# Patient Record
Sex: Male | Born: 1935
Health system: Southern US, Community
[De-identification: ages and names within clinical notes are randomized; demographics above are authoritative.]

---

## 2003-02-06 ENCOUNTER — Inpatient Hospital Stay (HOSPITAL_COMMUNITY): Admission: AD | Admit: 2003-02-06 | Discharge: 2003-02-14 | Payer: Self-pay | Admitting: Cardiology

## 2003-02-08 ENCOUNTER — Encounter: Payer: Self-pay | Admitting: Cardiology

## 2003-02-09 ENCOUNTER — Encounter: Payer: Self-pay | Admitting: Cardiothoracic Surgery

## 2003-02-10 ENCOUNTER — Encounter: Payer: Self-pay | Admitting: Cardiothoracic Surgery

## 2004-09-21 ENCOUNTER — Ambulatory Visit: Payer: Self-pay | Admitting: Cardiology

## 2005-04-27 ENCOUNTER — Ambulatory Visit: Payer: Self-pay | Admitting: Cardiology

## 2005-05-02 ENCOUNTER — Ambulatory Visit: Payer: Self-pay | Admitting: Internal Medicine

## 2005-11-24 ENCOUNTER — Ambulatory Visit: Payer: Self-pay | Admitting: Cardiology

## 2005-12-11 ENCOUNTER — Emergency Department (HOSPITAL_COMMUNITY): Admission: EM | Admit: 2005-12-11 | Discharge: 2005-12-11 | Payer: Self-pay | Admitting: Emergency Medicine

## 2006-01-19 ENCOUNTER — Ambulatory Visit: Payer: Self-pay | Admitting: Pulmonary Disease

## 2006-01-19 ENCOUNTER — Inpatient Hospital Stay (HOSPITAL_COMMUNITY): Admission: RE | Admit: 2006-01-19 | Discharge: 2006-01-25 | Payer: Self-pay | Admitting: Neurosurgery

## 2006-01-22 ENCOUNTER — Ambulatory Visit: Payer: Self-pay | Admitting: Physical Medicine & Rehabilitation

## 2006-03-21 ENCOUNTER — Ambulatory Visit: Payer: Self-pay | Admitting: Pulmonary Disease

## 2006-07-30 ENCOUNTER — Ambulatory Visit: Payer: Self-pay | Admitting: Cardiology

## 2006-12-14 ENCOUNTER — Ambulatory Visit: Payer: Self-pay | Admitting: Pulmonary Disease

## 2007-01-23 ENCOUNTER — Ambulatory Visit: Payer: Self-pay | Admitting: Cardiology

## 2007-02-05 ENCOUNTER — Ambulatory Visit: Payer: Self-pay | Admitting: Cardiology

## 2007-12-09 IMAGING — CR DG CHEST 2V
2 series · 2 of 2 positions shown · non-contrast
Comparison: 01/24/06.

CLINICAL DATA: 82 year old; COPD.
 CHEST - 2 VIEW:

[view not recorded (1 of 2)]
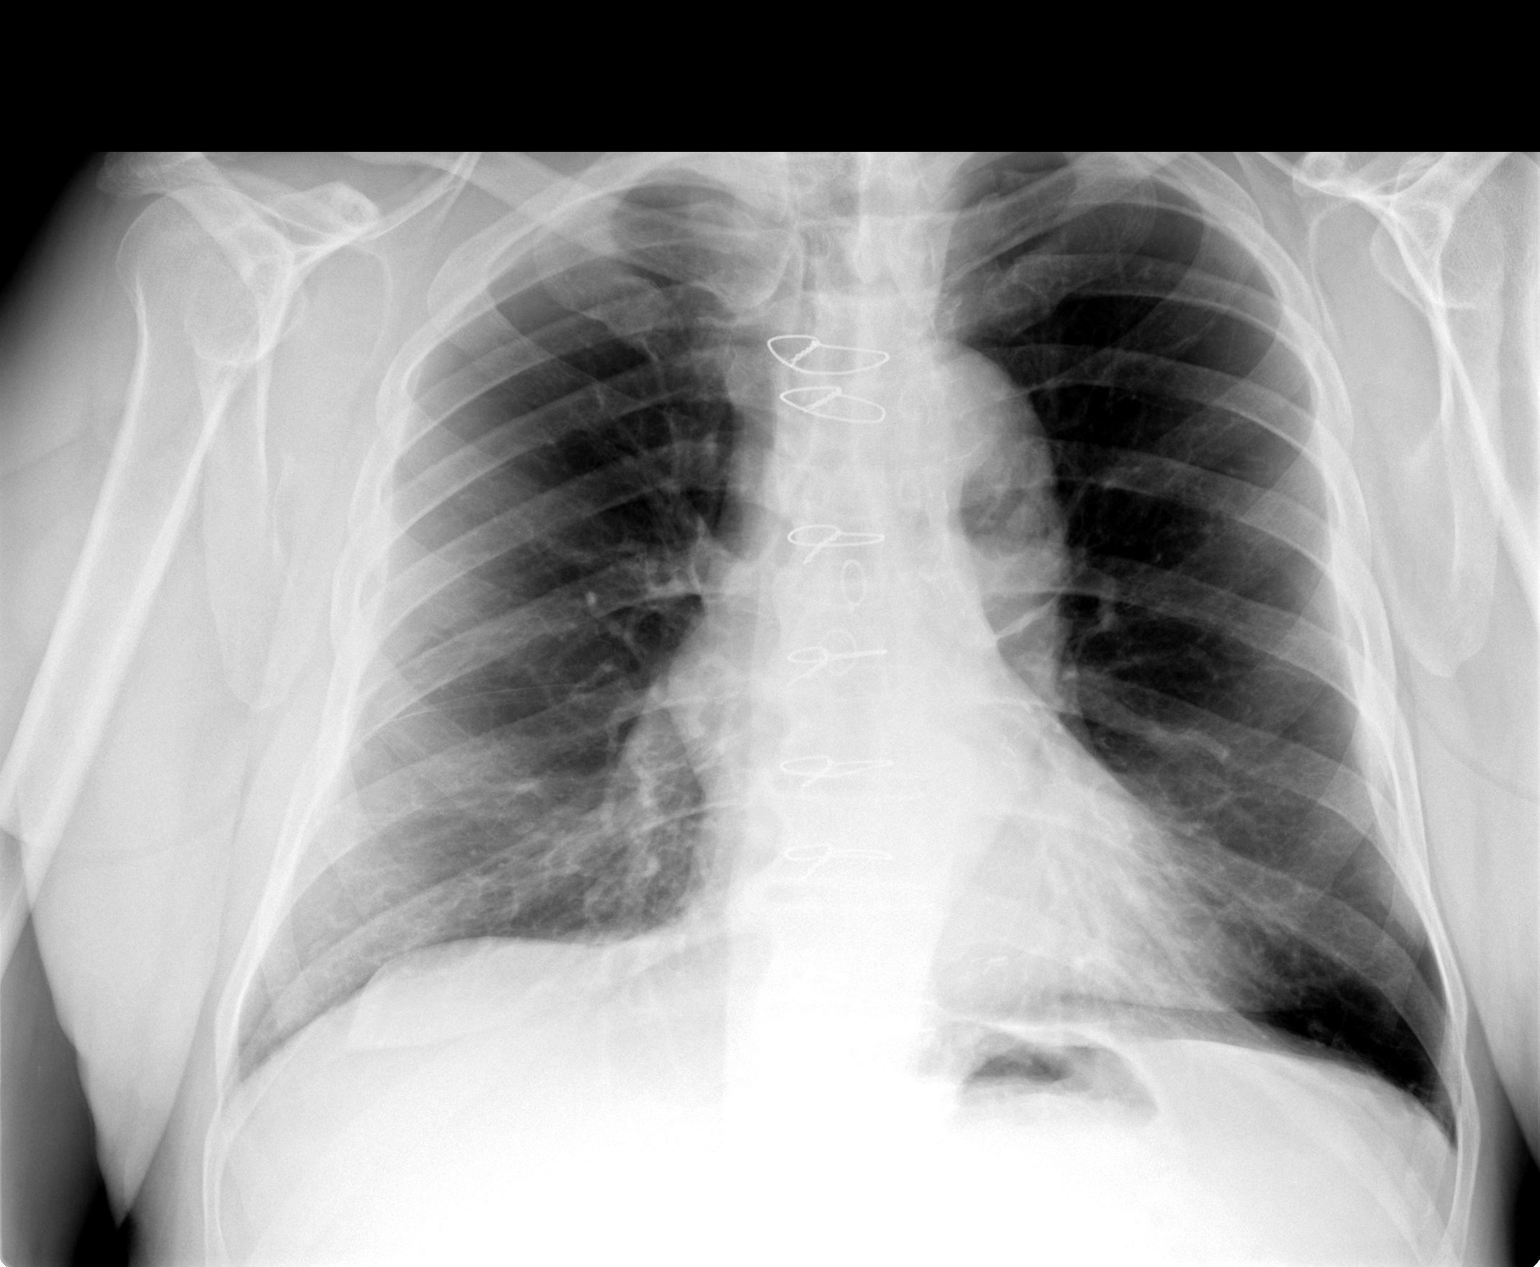

[view not recorded (2 of 2)]
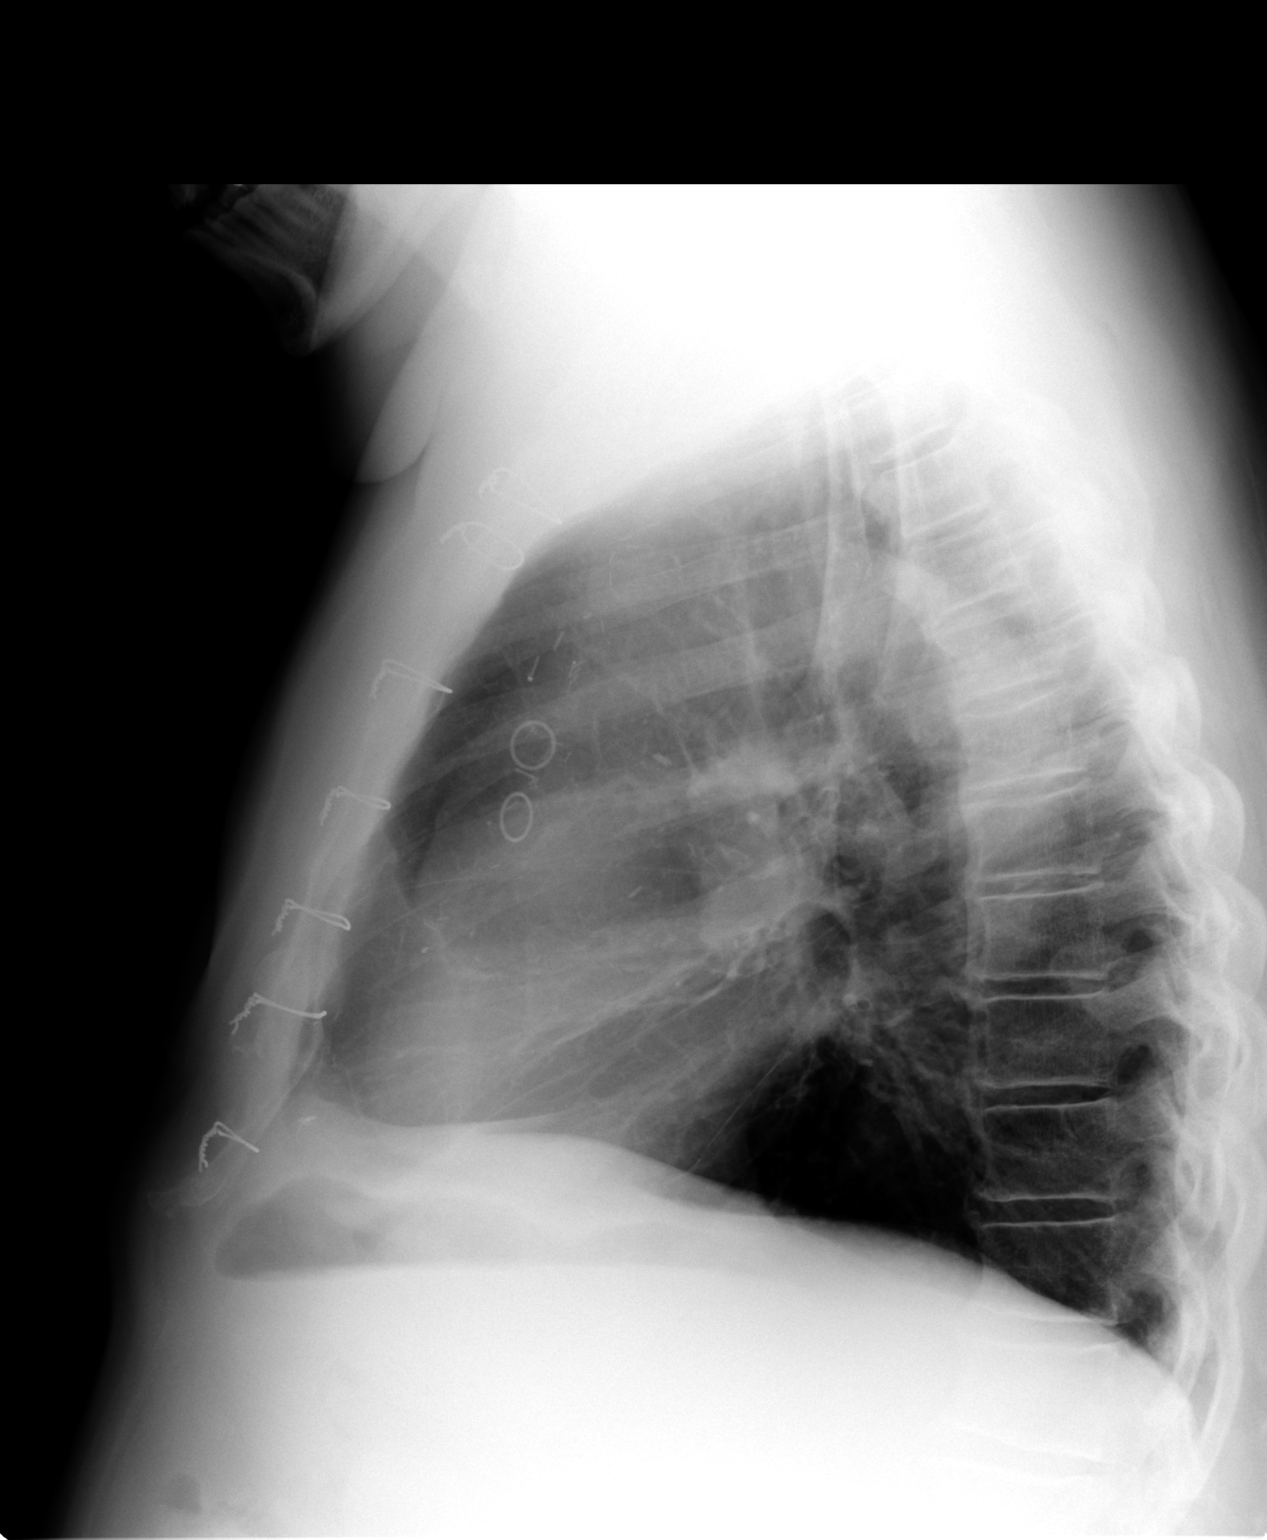

[2 of 2 positions shown; findings below may reference images not displayed]

FINDINGS: The cardiac silhouette, mediastinal and hilar contours are within normal limits and stable.  There is tortuosity and ectasia of the thoracic aorta.  There are underlying changes of COPD.  There are low lung volumes with vascular crowding and bibasilar atelectasis.  No edema or effusions.  The bony structures are intact.
IMPRESSION: 1.  Stable underlying changes of COPD.  
 2.  Low lung volumes with vascular crowding and basilar atelectasis.

## 2010-08-30 NOTE — Assessment & Plan Note (Signed)
[facility] OFFICE NOTE   Zachary, Fuentes                    MRN:          [id number]  DATE:01/23/2007                            DOB:          October 02, 1923    CARDIOLOGIST:  Dr. Terald Sleeper.   PRIMARY CARE PHYSICIAN:  Dr. Rowe Pavy.   REASON FOR VISIT:  Six month follow up.   HISTORY OF PRESENT ILLNESS:  [name] is a very pleasant 77 year old  male patient with a history of coronary artery disease status post CABG  in 2004 who presents to the office today for a six month follow up.  Since we last saw him, he has been admitted to [facility], at  least twice, with chronic obstructive pulmonary disease exacerbation.  The last admission was actually January 16, 2007 and he was discharged on  October 5. He was treated with steroids, antibiotics, and Mucinex.   In the office today, the patient notes that he is improving. His cough  is reduced and his breathing is much better.   He is unfortunately unsure about his medications. He is no longer on  hydrochlorothiazide or amlodipine. His wife is actually with him today  and says that he has never had high blood pressure. He has run out of  his Metoprolol and Lovastatin.   The patient does tell me that he has had some substernal chest pain that  he describes as pressure. This can come on at any time and it seems to  be better with changes in head positioning. He denies any associated  shortness of breath, nausea, or diaphoresis. He does feel it in his  shoulders and down his bilateral arms. His wife says that he has been  complaining of this pain for the last two years and there has been no  change. He denies orthopnea, PND, or pedal edema. He does note chronic  dyspnea with exertion and describes NYHA class 2B symptoms. He denies  any syncope and near-syncope.   He does tell me today that he has had some right leg pain with walking.  This is in his  calf.   CURRENT MEDICATIONS:  1. Glimepiride 4 mg daily.  2. Metoprolol 25 mg 1/2 tablet b.i.d. - he has currently been out for      the last week.  3. Metformin 500 mg b.i.d.  4. Aspirin 81 mg daily.  5. Lovastatin 40 mg nightly - he has been out of this medication for      about a week.  6. Foradil 12 mcg inhaler b.i.d.  7. Spiriva 1 inhalation daily.  8. Pro Air p.r.n.  9. Levaquin 500 mg daily until finished.  10.Mucinex 600 mg b.i.d.  11.Prednisone taper.   PHYSICAL EXAMINATION:  GENERAL:  He is a well developed, well nourished  male in no acute distress.  VITAL SIGNS:  Blood pressure 120/75, pulse 72, weight 185.4 pounds.  HEENT:  Normal.  NECK:  Without JVD.  CARDIAC:  Normal S1, S2. Regular rate and rhythm without murmur.  LUNGS:  Clear to auscultation bilaterally without wheezing, rhonchi, or  rales.  ABDOMEN:  Soft and nontender with normal active bowel sounds, no  organomegaly.  EXTREMITIES:  Without edema. Calves are soft and nontender.  SKIN:  Warm and dry.  NEUROLOGIC:  He is alert and oriented x3. Cranial nerves II-XII are  intact.  VASCULAR:  Carotids without bruits bilaterally. Femoral artery pulses  are 2+ bilaterally without bruits. Dorsalis pedis and posterior tibialis  pulses are 2+ bilaterally.   Electrocardiogram in the office today reveals sinus rhythm with a heart  rate of 70. Left axis deviation, no acute changes.   IMPRESSION:  1. Chest pain.  2. Coronary artery disease.      a.     Status post CABG in 2004.      b.     Non-ischemic Cardiolite study in January 2007.  3. Good LV function.  4. Hypertension.  5. Dyslipidemia.  6. Diabetes mellitus.  7. Chronic obstructive pulmonary disease.      a.     Status post recent admission for chronic obstructive       pulmonary disease exacerbation.  8. Right lower extremity pain with exertion.      a.     Rule out claudication.   PLAN:  The patient presents to the office today for follow up. He  has  been admitted to the hospital twice for chronic obstructive pulmonary  disease exacerbations over the last several months. He was just  discharged recently. Unfortunately, he is not taking some of his  medications. He does have hydrochlorothiazide and Norvasc listed on his  medications, but his blood pressure looks to be fairly well controlled.  He should be on Metoprolol, as he does have a history of inferior  hypokinesis on a nuclear study in the past. He should also be on a  statin for his cholesterol. He is complaining of some chest and shoulder  pain. This is somewhat atypical, as it has been ongoing for the last  couple of years. However, he does have history of coronary artery  disease and his grafts are over 5 years old now. At this point in  time, we plan:  1. Proceed with stress Cardiolite testing to rule out the possibility      of ischemia.  2. Refill his Lovastatin and Metoprolol.  3. Make sure that he has follow up lipids and LFTs in the next several      weeks.  4. We will arrange ABIs and arterial Dopplers to rule out the      possibility of peripheral arterial disease.  5. We will bring him back in follow up with Dr. Dannielle Burn in the next      couple of weeks to review the above testing.      Richardson Dopp, PA-C  Electronically Signed      Ernestine Mcmurray, MD,FACC  Electronically Signed   SW/MedQ  DD: 01/23/2007  DT: 01/24/2007  Job #: [id number]   cc:   Sherril Cong, MD

## 2010-08-30 NOTE — Assessment & Plan Note (Signed)
[facility]                             PULMONARY OFFICE NOTE   JAMESRYAN, KRAMMER                    MRN:          [id number]  DATE:12/14/2006                            DOB:          30-Dec-1923    I saw Mr. Korb today in follow up for his chronic obstructive  pulmonary disease.   He says that he was admitted to [facility] about 3 to 4 weeks  ago. He was treated for a respiratory problem although he is not exactly  sure what it was. From his symptom description it sounds like he had a  chronic obstructive pulmonary disease exacerbation, and I believe that  he was treated with a course of antibiotics and possibly steroids. He  says that since that time his breathing has improved and he is feeling  reasonably well. He is not having any problems with coughing, sputum  production, or wheezing. He also denies symptoms of chest pain.   CURRENT MEDICATIONS:  1. Spiriva 1 puff daily.  2. Foradil 1 puff b.i.d.  3. Aspirin 81 mg daily.  4. Norvasc 10 mg daily.  5. Metformin 500 mg b.i.d.  6. Amaryl 4 mg daily.  7. Lovastatin 40 mg nightly.  8. ProAir HFA 2 puffs q.i.d. p.r.n.   PHYSICAL EXAMINATION:  VITAL SIGNS:  Weight 184 pounds, temperature  97.7, blood pressure 110/74, heart rate 80, oxygen saturation 94% on  room air.  HEENT:  There is no sinus tenderness, no oral lesions, no  lymphadenopathy.  HEART:  S1, S2.  CHEST:  Prolonged expiratory phase, but no wheezing or rales.  ABDOMEN:  Obese, soft, nontender.  EXTREMITIES:  No edema.   IMPRESSION:  Chronic obstructive pulmonary disease. He is doing  reasonably well on his inhaler regimen of Spiriva and Foradil, and I  would continue to monitor this. If he does start developing more  frequent exacerbations, it may be beneficial to add in an inhaled  corticosteroid to his regimen. I will also have him undergo a chest x-  ray in my office today and I will call him with the results  of this  sometime next week. If there are any abnormalities on this, I will  arrange for further intervention. Otherwise, I plan on following up with  him in approximately six months.     [name], MD  Electronically Signed    VS/MedQ  DD: 12/14/2006  DT: 12/16/2006  Job #: [id number]

## 2010-09-02 NOTE — Assessment & Plan Note (Signed)
[facility]                             PULMONARY OFFICE NOTE   [name], [name]                    MRN:          [id number]  DATE:03/21/2006                            DOB:          1923-08-15    I saw [name] today in followup for his COPD.   He is doing quite well with his inhaler regimen of Spiriva 1 puff daily,  and Foradil 1 puff b.i.d. He is no longer having any symptoms of  coughing, wheezing, sputum production or chest tightness. He says really  right now his activities are limited more by his hip and leg pain,  rather than his breathing.   CURRENT MEDICATIONS:  1. Spiriva 1 puff daily.  2. Foradil 1 puff b.i.d.  3. Aspirin 81 mg daily.  4. Norvasc.  5. Lopressor.  6. Metformin.  7. Amaryl.   PHYSICAL EXAMINATION:  Weight 165 pounds, temperature 97.7, blood  pressure 116/74, heart rate 77, oxygen saturation 97% on room air.  HEENT: There is no sinus tenderness, no nasal discharge, no oral  lesions.  HEART: S1, S2.  CHEST: No wheezing or rales.  ABDOMEN: Soft, non-tender.  EXTREMITIES: No edema.   IMPRESSION:  Chronic obstructive pulmonary disease with good response to  his inhaler regimen of Spiriva and Foradil. I have given prescriptions  to continue on this. He says that he believes he had received his  influenza vaccination prior to hospital discharge, and he had also had  his pneumonia shot approximately 4 years ago.   I will followup with him in approximately 6 months.     Zachary Mires, MD  Electronically Signed    VS/MedQ  DD: 03/21/2006  DT: 03/22/2006  Job #: [id number]   cc:   [name], M.D.  Zachary Denton, MD,FACC

## 2010-09-02 NOTE — Assessment & Plan Note (Signed)
[facility] OFFICE NOTE   LARSON, RAITT                    MRN:          [id number]  DATE:11/24/2005                            DOB:          1923-07-16    HISTORY OF PRESENT ILLNESS:  The patient is a 77 year old male with a  history of coronary artery disease, status post coronary artery bypass  grafting in 2004.  The patient was last seen in January 2007.  At that time  he complained of subscapular pain as well as some chest discomfort.  In the  interim the patient has undergone stress testing in January 2007, which  demonstrated normal perfusion data with no inducible ischemia.  The ejection  fraction was 54%.  The patient is now seen in follow-up.  He does appear to  have some lower extremity edema.  He also complains of burning in both feet.  He has a history of chronic back pain and has noted some decreased strength  in the lower extremities.  The patient's wife is concerned regarding  arterial insufficiency; however, the patient has reasonably good pulses on  lower extremity exam.  He denies any chest pain, shortness of breath,  orthopnea or PND.   MEDICATIONS:  1. Doxycycline.  2. Glimepiride 4 mg a day.  3. Metoprolol 25 mg a day.  4. Hydrochlorothiazide 25 mg a day.  5. Metformin 500 mg p.o. b.i.d.  6. Lovastatin 10 mg a day.  7. Norvasc 10 mg a day.   PHYSICAL EXAMINATION:  VITAL SIGNS:  Blood pressure 122/70, heart rate 84  beats per minute, weight is 179 pounds.  NECK:  Normal carotid upstroke, no carotid bruits.  LUNGS:  Clear breath sounds bilaterally.  CARDIAC:  Regular rate and rhythm with normal S1, S2, no murmurs, rubs or  gallops.  ABDOMEN:  Soft, nontender, no rebound or guarding, good bowel sounds.  CHEST:  Sternotomy scar is well-healed.  EXTREMITIES:  No edema, and peripheral pulses are palpable.   PROBLEM LIST:  1. Coronary artery disease, status post coronary  artery bypass grafting.  2. Hypertension, controlled.  3. Dyslipidemia.  4. History of pancreatitis.  5. Remote nephrolithiasis.  6. History of diabetes mellitus.  7. Chronic lumbar disk disease with chronic low back pain.   PLAN:  1. The patient was referred for ABIs.  He also was referred for EMGs and      nerve conduction velocities.  2. Laboratory work was done today.  This includes BMET, TSH and BNP.  3. I explained to the patient and the patient's wife that from a      cardiovascular perspective he appears to be stable.  I also do not      think that his lower extremity edema is secondary to congestive heart      failure.  I also do not think he has peripheral insufficiency.   ADDENDUM:  Addendum dated January 11, 2006.   ABIs were reviewed on this patient, which were 0.94 and 0.96, respectively,  left and right.  There is no evidence of significant vascular insufficiency.  His chest x-ray was within normal limits.  Creatinine was 1.3, potassium was  4.4, chloride was 96.  TSH 1.62.  BNP is 17.  Based on review of this data  and the patient's previous stress test, there should be no contraindication  for this patient to proceed with back surgery in case this is needed.  I  suspect that the patient's chronic low back pain and lower extremity pain is  related to chronic lower back disease.  The patient, I anticipate, will be  seen in the future by neurosurgery.       Ernestine Mcmurray, MD,FACC     GED/MedQ  DD:  01/11/2006  DT:  01/13/2006  Job #:  [id number]   cc:   Sherril Cong, M.D.

## 2010-09-02 NOTE — Cardiovascular Report (Signed)
NAME:  [name], [name] NO.:  [id number]   MEDICAL RECORD NO.:  [id number]                   PATIENT TYPE:  OIB   LOCATION:  2859                                 FACILITY:  [facility]   PHYSICIAN:  Loretha Brasil. Lia Foyer, M.D.             DATE OF BIRTH:  [birth year]   DATE OF PROCEDURE:  02/06/2003  DATE OF DISCHARGE:                              CARDIAC CATHETERIZATION   INDICATIONS:  [name] is a pleasant 77 year old gentleman who presents  with both progressive shortness of breath as well as substernal chest pain.  The patient is preoperative evaluation for hernia repair.  Because of this  he was subsequently referred for further evaluation.  He had a dense  inferior defect with partial reversibility towards the apex noted on  Cardiolite and anteroseptal defect as well.  Ejection fraction was 56% with  inferior hypokinesis.  Because of this he was referred for left heart  catheterization.  He also was thought to have some chronic obstructive  pulmonary disease and possibly pulmonary hypertension and therefore work-up  suggested for right heart catheterization as well.   PROCEDURE:  1. Right and left heart catheterization.  2. Selective coronary arteriography.  3. Selective left ventriculography.  4. Subclavian angiography.   DESCRIPTION OF PROCEDURE:  The patient was brought to the catheterization  laboratory and prepped and draped in the usual fashion.  Through an anterior  puncture after informed consent the right femoral vein was entered.  Right  heart catheterization was performed with a 7.5-French thermodilution Swan-  Ganz catheter.  He tolerated procedure well and there were no complications  with right heart catheterization.  Following this a 6-French sheath was  placed in the left coronary artery.  Several catheters were required to  successfully engage the left main but we engaged successfully with a 6-  [place] guiding catheter JL4.  Good  opacification as achieved in the left  coronary artery.  Following right coronary arteriography, subclavian  angiography was performed and the procedure completed.  The patient  tolerated procedure well and there were no complications.   HEMODYNAMIC DATA:  1. Right atrium 11.  2. RV 33/13.  3. Pulmonary artery 35/15.  4. Pulmonary capillary wedge 14.  5. Aortic 145/82/  6. LV 145/20.  7. No aortic to left ventricular gradient.  8. No mitral valve gradient.  9. Thermodilution cardiac output 5.3 L/minute.  10.      Thermodilution cardiac index 2.7 L/minute/sq m.  11.      Aortic saturation 95%.  12.      Pulmonary artery saturation  73%.  13.      Superior vena cava saturation 74%.   ANGIOGRAPHIC DATA:  1. Ventriculography in the RAO projection revealed well preserved global     systolic function.  No segmental abnormalities of contraction were     identified.  2. The left main coronary artery is free of critical disease.  3. The left anterior descending artery coursed to the apex and supplied a     portion of the distal inferior wall.  There is a tiny first diagonal     branch and then a second diagonal branch that had 75% segmental     narrowing.  Just after the second diagonal branch and septal perforator     there is a 75% stenosis followed by an area of aneurysmal dilatation.     Following the area of aneurysmal dilatation there is segmental diffuse     disease of about 80%.  The distal LAD is a graftable vessel.  4. The circumflex provides predominantly one bifurcating marginal.  There is     about 75% segmental narrowing in the mid portion followed by an area of     aneurysmal dilatation.  The vessel after this bifurcates.  5. The right coronary artery has about a 20-30% mid area and has diffuse     ectasia and aneurysmal dilatation throughout without any high grade areas     of focal stenosis.  6. The subclavian appears to be widely patent.   CONCLUSIONS:  1. Preserved  left ventricular function.  2. High grade disease of the left anterior descending artery that is     unfavorable for percutaneous stenting and involves a second diagonal.  3. Significant disease of the circumflex coronary artery.   RECOMMENDATIONS:  Surgical consultation for possible revascularization.                                               Loretha Brasil. Lia Foyer, M.D.    TDS/MEDQ  D:  02/06/2003  T:  02/06/2003  Job:  [id number]   cc:   Satira Sark, M.D. [facility]   Sherie Don, M.D.   [facility]

## 2010-09-02 NOTE — Consult Note (Signed)
NAMEJEBIDIAH, [name] NO.:  [id number]   MEDICAL RECORD NO.:  [id number]          PATIENT TYPE:  INP   LOCATION:  3032                         FACILITY:  [facility]   PHYSICIAN:  [name], MD        DATE OF BIRTH:  02-Sep-1923   DATE OF CONSULTATION:  01/24/2006  DATE OF DISCHARGE:                                   CONSULTATION   REFERRING PHYSICIAN:  Marchia Meiers. Vertell Limber, M.D.   REASON FOR CONSULTATION:  Evaluation of hypoxemia.   Mr. Fuentes is a 77 year old male who underwent lumbar laminectomy with  diskectomy and fusion with pedicle screw placement on October 5.  Postoperatively he has been recovering as expected but has had episodes of  oxygen saturation down to the mid 80s while off of oxygen.  The patient  apparently has chronic symptoms of dyspnea at baseline and he says that he  is currently at his baseline status at present.  He says that when he gets  his nebulizer treatments here in the hospital he notes a significant  improvement in symptoms.  His outpatient inhaler regimen has consisted of  albuterol as needed which he uses four to five times a day.  He also  complained of having chronic cough with production of whitish to yellow  green sputum as well as having chest tightness.   PAST MEDICAL HISTORY:  Significant for coronary artery disease status post  coronary bypass grafting 2004.  He has had a prior myocardial infarction.  He has diabetes, COPD.  He had an aortic aneurysm.   SOCIAL HISTORY:  He stopped smoking 20 years ago, smoked about two packs per  week but also smoked two to three cigars per day.  He says he started at the  age of 62.  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   FAMILY HISTORY:  Significant for asthma.  Three of his sisters had cancer.  Heart disease and diabetes also run in the family.   ALLERGIES:  He has no known drug allergies.   MEDICATIONS AT HOME:  Albuterol, lovastatin, metformin, hydrocodone,  hydrochlorothiazide, metoprolol, Amaryl, Klor-Con and aspirin.   REVIEW OF SYSTEMS:  Is essentially negative except as stated above.   PHYSICAL EXAM:  VITAL SIGNS:  Temperature 97.2, heart rate 67, blood  pressure 128/68, respiratory rate is 18, oxygen saturation 92% on 2 L.  HEENT:  Pupils reactive.  Extraocular movements are intact. There is no  sinus tenderness.  There is no JVD.  No oral lesions.  No lymphadenopathy.  HEART:  Was S1-S2, regular rate and rhythm.  CHEST:  Decreased breath sounds with fine rales, fine wheezing heard, but no  rubs.  ABDOMEN:  Was thin, soft, nontender.  EXTREMITIES:  There is no edema, cyanosis or clubbing.  NEUROLOGIC:  No focal deficits were appreciated.  Chest x-ray showed flattening of the diaphragms with hyperinflation.   MOST RECENT LABS:  Show a WBC of 10.6, hemoglobin is 15.9, hematocrit  __________ , platelet count is 365, sodium 141, potassium 4.6, chloride is  103, CO2 is 27, BUN is 12, creatinine is  1.2, glucose is 80.   IMPRESSION:  Hypoxemia with persistent dyspnea with probable chronic  respiratory failure.  This is most likely secondary to obstructive airway  disease with chronic obstructive pulmonary disease.  I will change his  inhaler regimen so that he will be started on Spiriva 1 puff q.d. as well as  Foradil 1 puff b.i.d. I will check a room air arterial blood gas to  determine if he needs to be set up for home oxygen therapy.  He will need to  have outpatient pulmonary function tests done.  I will also give him a short  course of antibiotics with the Zithromax to help improve his bronchitis  symptoms then depending on his response to this, I determine if he needs to  have an inhaled corticosteroid added to his medical regimen.      [name], MD  Electronically Signed     VS/MEDQ  D:  01/24/2006  T:  01/25/2006  Job:  [id number]

## 2010-09-02 NOTE — Assessment & Plan Note (Signed)
[facility] CARDIOLOGY OFFICE NOTE   [name], [name]                    MRN:          [id number]  DATE:07/30/2006                            DOB:          [birth year]    HISTORY OF PRESENT ILLNESS:  The patient is a 77 year old male with  history of coronary artery disease status post cardiac bypass graft in  2004.  The patient had a stress test in 2007, which was negative for  ischemia.  He has been doing well and reports no chest pain.  He  underwent back surgery in October of 2007.  He reports no palpitations  or syncope.  From a cardiovascular perspective, he is asymptomatic.  He  does complain of neck pain.   MEDICATIONS:  1. Glimepiride 4 mg p.o. daily.  2. Metoprolol 25 mg p.o. daily.  3. Hydrochlorothiazide 25 mg a day.  4. Metformin 500 mg p.o. b.i.d.  5. Naproxen 500 mg p.o. b.i.d.  6. Aspirin 81 mg a day.   PHYSICAL EXAMINATION:  VITAL SIGNS:  Blood pressure 140/82.  Heart rate  67.  Weight 179 pounds.  NECK:  Normal carotid upstroke.  No carotid bruits.  LUNGS:  Clear breath sounds bilaterally.  HEART:  Regular rate and rhythm, normal S1, S2.  No murmurs, rubs, or  gallops.  ABDOMEN:  Soft, nontender.  No rebound or guarding.  Good bowel sounds.  EXTREMITIES:  No cyanosis, clubbing, or edema.  NEUROLOGIC:  Patient alert, oriented, and grossly nonfocal.   PROBLEMS:  1. Coronary artery disease status post coronary artery bypass graft.  2. Hypertension, controlled.  3. Dyslipidemia.  4. History of pancreatitis.  5. History of remote nephrolithiasis.  6. Diabetes mellitus.  7. Status post lower back pain.   PLAN:  1. The patient is doing well from a cardiovascular perspective.  2. The patient will need to have a lipid panel done and this can be      done by Dr. Sherrie Sport.  3. From a cardiovascular perspective, no changes were made in the      patient's medical regimen.     Ernestine Mcmurray,  MD,FACC  Electronically Signed    GED/MedQ  DD: 07/30/2006  DT: 07/31/2006  Job #: [id number]

## 2010-09-02 NOTE — Op Note (Signed)
   NAME:  [name], [name]                       ACCOUNT NO.:  [id number]   MEDICAL RECORD NO.:  [id number]                   PATIENT TYPE:  INP   LOCATION:  4729                                 FACILITY:  [facility]   PHYSICIAN:  Sigmund I. Gaynelle Arabian, M.D.         DATE OF BIRTH:  08-Mar-1924   DATE OF PROCEDURE:  02/09/2003  DATE OF DISCHARGE:                                 OPERATIVE REPORT   PROCEDURE:  Intraoperative consultation and procedure for patient with  inability to pass Foley catheter prior to coronary artery bypass surgery.   OBJECTIVE:  With the patient in a supine position, cystourethroscopy was  accomplished with the flexible cystoscope.  A severe obstruction of the  prostatic urethra was identified, with the prostate completely obliterated  except for a tiny approximately 2-3 mm opening.  There were several false  passages identified as well.  An 0.035 guidewire was passed through the  lumen under direct vision, and the stricture was subsequently dilated to a  size 22 [place] with the Haymann dilators.  Following this, the specialized  16 Foley catheter was placed without difficulty and irrigated free of any  blood.  The patient tolerated this portion of the procedure well, was  covered with IV antibiotics.  He will be followed up in the hospital  following his surgery.                                               Sigmund I. Gaynelle Arabian, M.D.    SIT/MEDQ  D:  02/09/2003  T:  02/09/2003  Job:  [id number]   cc:   Len Childs, M.D.  [address]  [place]  [place] [address]

## 2010-09-02 NOTE — Discharge Summary (Signed)
NAME:  [name], [name] NO.:  [id number]   MEDICAL RECORD NO.:  [id number]                   PATIENT TYPE:  INP   LOCATION:  2041                                 FACILITY:  [facility]   PHYSICIAN:  Ivin Poot, M.D.               DATE OF BIRTH:  1924-04-05   DATE OF ADMISSION:  02/06/2003  DATE OF DISCHARGE:  02/14/2003                                 DISCHARGE SUMMARY   HISTORY OF PRESENT ILLNESS:  This is a 77 year old married white male  with  no prior history of coronary artery disease, who was seen by Dr. Dannielle Burn on  January 21, 2003, for preoperative evaluation  prior to hernia repair. The  patient had risk factors for coronary artery disease including age, gender,  hypertension and dyslipidemia. He also had a previous  history of chest  discomfort  and was seen in 2001, but that was felt to be musculoskeletal,  and a Myoview study was done showing no ischemia at that time.   The patient underwent a nuclear stress test on January 23, 2003, and  exercised to a maximum work load of 5.6 mets without chest pain. There were  no diagnostic EKG changes, however, he had fairly dense inferior defect with  partial reversibility towards the apex and also involving the  antero septum  at the apex. The ejection fraction was 56% with inferior  hypokinesis. This  was felt to be consistent with underlying coronary artery disease and he was  felt  to require further  delineation of  his anatomy and he was admitted  this hospitalization  for cardiac catheterization and further treatment  pending those results.   ALLERGIES:  No known drug allergies.   MEDICATIONS:  1. Lipitor 10 mg q. h.s.  2. Norvasc 10 mg every day.  3. Theo-Dur 300 mg b.i.d.  4. Protonix 40 mg daily.  5. Albuterol via nebulizer every day and albuterol hand-held nebulizer every     day p.r.n.  6. Clarinex 5 mg every day.  7. Percocet p.r.n.  8. Aspirin 1 every day.   PAST MEDICAL HISTORY:  1. Hypertension.  2. Hyperlipidemia.  3. History of nephrolithiasis.   Family history, social history, review of systems and physical examination  please see the history and physical done at the time of admission.   HOSPITAL COURSE:  The patient was admitted and electively underwent cardiac  catheterization on February 06, 2003, by Dr. Lia Foyer. He was found to have  multivessel coronary artery disease including  a severe lesion at the LAD  measured at 75%, followed  by aneurysmal dilatation as well as an 80% mid  artery lesion with diffuse disease. Other findings include a 75% left  circumflex lesion also followed  by aneurysmal dilatation and the right  coronary artery had a 20% to 30% mid vessel lesion.   Due to these findings, the patient was felt to be  a candidate for surgical  revascularization and a cardiac surgical consultation was obtained with Dr.  Tharon Aquas Trigt who evaluated the patient and his studies and recommended  the procedure. The patient agreed and on February 09, 2003, the patient  underwent the following procedure, coronary artery bypass grafting x3. The  following grafts were placed:  1. Left internal mammary artery to the left anterior descending artery.  2. Saphenous vein graft to the OM1.  3. Saphenous vein graft  to the diagonal.   The patient tolerated the procedure well. He was taken to the surgical  intensive care unit in stable condition. Of note the patient was found prior  to the procedure to have a urethral stricture and required cystoscopic  passage of his Foley catheter by Dr. Gaynelle Arabian. The findings included  several false passages.   The patient has done well postoperatively. He  has maintained stable  hemodynamics. His intensive care unit stay was unremarkable. His initial  hematocrit postoperatively was 30 on postoperative day #1. Other laboratory  values have remained stable. He has tolerated a rapid  increase  in  activities commensurate   for the level of postoperative convalescence using  cardiac rehabilitation phase 1 modalities. His incisions are healing well.  He has been weaned from oxygen and maintains good saturations on room air.  All routine lines, monitors and drainage devices have been discontinued in a  standard fashion. Currently the patient does have his Foley catheter in  place, but pending  a voiding trial, this will be removed, and if there are  no difficulties, tentatively he is felt to be quite stable for discharge  on  the morning of February 14, 2003, pending  reevaluate. The patient is also  noted to have had no significant cardiac dysrhythmias or ectopy  postoperatively.   DISCHARGE MEDICATIONS:  1. Claritin 5 mg daily.  2. Lipitor 10 mg q. h.s.  3. Theophylline 300 mg b.i.d.  4. Aspirin 325 mg p.o. daily.  5. Protonix 40 mg daily.  6. Toprol XL 25 mg daily.  7. Ultram 50 mg 1 q.6h. p.r.n. pain.   DISCHARGE INSTRUCTIONS:  The patient received written instructions regarding  medications, activity, diet, wound care and follow up.   FOLLOW UP:  Dr. Dannielle Burn on Wednesday March 04, 2003, at 11:30 and Dr. Prescott Gum on Friday March 27, 2003, at 1:45.   CONDITION ON DISCHARGE:  Stable and improved.   FINAL DIAGNOSES:  1. Severe coronary artery disease as delineated above.  2. Hypertension.  3. Hyperlipidemia.  4. History of nephrolithiasis.  5. Urethral strictures with several false passages per cystoscopy done prior     to surgical revascularization of his coronaries.  6. Mild postoperative anemia.      John Giovanni, P.A.-C.                    Ivin Poot, M.D.    Loren Racer  D:  02/13/2003  T:  02/14/2003  Job:  [id number]   cc:   Len Childs, M.D.  [address]  [place]  [place] [address]   Ernestine Mcmurray, M.D.  [address]  [place] [address]   Sigmund I. Gaynelle Arabian, M.D.  [place] [place]  [place] [address]  Fax: [phone]

## 2010-09-02 NOTE — H&P (Signed)
NAME:  [name], [name] NO.:  [id number]   MEDICAL RECORD NO.:  [id number]                   PATIENT TYPE:  OIB   LOCATION:                                       FACILITY:  [facility]   PHYSICIAN:  Satira Sark, M.D. [facility]        DATE OF BIRTH:  [birth year]   DATE OF ADMISSION:  02/06/2003  DATE OF DISCHARGE:                                HISTORY & PHYSICAL   CHIEF COMPLAINT:  Chest discomfort.   HISTORY OF PRESENT ILLNESS:  The patient is a 77 year old married white male  with no prior history of coronary artery disease, seen by Dr. Dannielle Burn on  January 21, 2003, for preoperative evaluation  before he underwent  hernia  repair. The patient had risks for coronary artery disease which included his  age, gender, hypertension and dyslipidemia. He also had a prior history of  chest discomfort  and was seen in 2001. At that time it was felt that his  discomfort  was musculoskeletal. A Myoview study was done and did not show  any ischemia.   As of late he reported to Dr. Waymon Amato on his preoperative evaluation that over  several weeks he has had  brief substernal chest discomfort. Some of his  discomfort is worse when he takes a deep breath. He will feel radiating pain  through to his back. He is fairly active and cuts his own lawn, but in  talking to him today  further  and talking with his wife, he actually is  very dyspneic just walking outside to the garage. He does not tend to have  chest discomfort, but is severely limited by dyspnea on exertion.   He was set up for and underwent nuclear stress testing on January 23, 2003.  The patient exercised to a maximum work load of 5.6 mets. He did not report  any chest pain. There were no diagnostic EKG changes. He had a fairly dense  inferior defect with partial reversibility towards the apex and also  involving the anteroseptum at the apex. The ejection fraction was 56% with  inferior  hypokinesis. This would be  consistent with underlying coronary  artery disease.   Because of this the patient will be admitted and undergo elective same day  heart  catheterization to define his coronary anatomy preoperatively. We  will also start him on 1 enteric coated aspirin a day and will increase his  Norvasc from 5 mg to 10 mg every day for better  blood pressure control.   ALLERGIES:  No known drug allergies.   MEDICATIONS:  1. Lipitor 10 mg p.o. q. h.s.  2. Norvasc 5 mg every day which was just  increased  to 10 mg every day.  3. Theo-Dur 300 mg b.i.d.  4. Protonix 40 mg every day.  5. Albuterol via nebulizer every day and albuterol hand-held nebulizer every     day p.r.n.  6. Clarinex 5 mg every  day.  7. Percocet  p.r.n.  8. We will also start him on 1 aspirin  every day.   PAST MEDICAL HISTORY:  1. Hypertension.  2. Hyperlipidemia.  3. History of nephrolithiasis.   REVIEW OF SYSTEMS:  He denies any recent weight gain, weight loss, fevers or  chills. He denies any chronic diarrhea or constipation. He has mild lower  extremity edema, worse with prolonged standing.   PHYSICAL EXAMINATION:  VITAL SIGNS:  Blood pressure 152/74, pulse 80 and  regular.  GENERAL:  Weight 193. An overweight, elderly white male in no acute  distress.  NECK:  Without jugular venous distention or carotid bruits.  LUNGS:  Decreased breath sounds at the bases but no wheezing, rales or  rhonchi.  HEART:  Heart sounds are distant and difficult to hear. Normal S1 and S2  without any detectable murmurs, rubs or clicks.  ABDOMEN:  Obese, protuberant, bowel sounds are present.  EXTREMITIES:  1+ pitting edema bilaterally. Pedal pulses palpable at 1+.  NEUROLOGIC:  He is alert and oriented and is grossly within normal limits.   IMPRESSION:  1. Substernal chest pain with positive nuclear study for ischemia on January 23, 2003. The patient being admitted now for diagnostic heart     catheterization to define his coronary  anatomy. The patient agrees,     understands and accepts the risks and benefits of the explained     procedure. We will add aspirin to his medical therapy.  2. Lower extremity edema, possibly secondary to pulmonary hypertension     and/or right heart failure. We may want to start low-dose diuretic at     some point.  3. Hypertension. Will increase his Norvasc to 10 mg every day and realize     this may be playing a role in some of his lower extremity edema, but     would like more optimal blood pressure control.  4. History of hyperlipidemia, on Lipitor.   PLAN:  Start aspirin, increase Norvasc, schedule for diagnostic heart  catheterization with further  recommendations pending acquisition of above.  We suggest this be a right and left heart  catheterization because of his  pulmonary disease.         Otelia Limes. Kathe Mariner.                    Satira Sark, M.D. [facility]    MRD/MEDQ  D:  02/02/2003  T:  02/02/2003  Job:  [id number]   cc:   Dr. Hyman Hopes  [facility]  [place], [place]   Tacey Heap, M.D.  [facility]  [place], [place]   Dr. [facility]

## 2010-09-02 NOTE — Op Note (Signed)
[name], [name] NO.:  [id number]   MEDICAL RECORD NO.:  [id number]          PATIENT TYPE:  INP   LOCATION:  3032                         FACILITY:  [facility]   PHYSICIAN:  Marchia Meiers. Vertell Limber, M.D.  DATE OF BIRTH:  Dec 31, 1923   DATE OF PROCEDURE:  01/19/2006  DATE OF DISCHARGE:                                 OPERATIVE REPORT   PREOPERATIVE DIAGNOSES:  Spondylosis, scoliosis, degenerative disk disease  and radiculopathy, L3-4 and L4-5 levels.   POSTOPERATIVE DIAGNOSES:  Spondylosis, scoliosis, degenerative disk disease  and radiculopathy, L3-4 and L4-5 levels.   PROCEDURES:  1. Laminectomy and diskectomy, L3-4 and L4-5 levels.  2. Bilateral laminectomy and diskectomy, L3-4 and L4-5 levels, with      posterior lumbar interbody fusion with 10-mm PEEK interbody cages      bilaterally, L3-4 and L4-5 levels, with Osteocel, and pedicle-screw      fixation, L3 through L5 bilaterally, with posterolateral arthrodesis.   SURGEON:  Marchia Meiers. Vertell Limber, M.D.   ASSISTANTS:  Ashok Pall, M.D.; [place], RN.   ANESTHESIA:  General endotracheal.   ESTIMATED BLOOD LOSS:  800 cc, with 500 cc of Cell Saver blood returned to  the patient.   COMPLICATIONS:  None.   DISPOSITION:  Recovery.   INDICATIONS:  [name] is a 77 year old man with severe right leg  greater than left leg pain with spondylosis, scoliosis and degenerative disk  disease at the L3-4 and L4-5 levels.  He had significant foraminal  compression of the L3 and L4 nerve roots as they extended out the neural  foramina.  It was elected to perform decompression and fusion at these  levels.   DESCRIPTION OF PROCEDURES:  [name] was brought to the operating room.  Following the satisfactory and uncomplicated induction of general  endotracheal anesthesia and placement of intravenous lines and a Foley  catheter, the patient was placed in a prone position on the operating table.  He was placed on a Jackson  table and soft tissue and bony prominences were  padded appropriately.  His low back was then prepped and draped in the usual  sterile fashion.  The area of the planned incision was infiltrated with  0.25% Marcaine and 0.5% lidocaine with 1:200,000 epinephrine.  An incision  was made in the midline and carried through to the lumbodorsal fascia.  It  was incised bilaterally.  Subperiosteal dissection was performed, exposing  the L3, L4 and L5 transverse processes.  Markers were placed at what were  felt to be the L4 and L5 transverse processes, and this was confirmed on  intraoperative x-ray.  Subsequently, a VersaTack retractor was placed to  facilitate exposure.  A total hemilaminectomy of L3 and L4 was performed on  the right side of midline, and both the L3 and L4 nerve roots were dissected  widely as they exited our their neural foramina.  Diskectomy was performed  at L3-4 and L4-5 on the right.  Subsequently, hemilaminectomies of L3 and L4  were performed on the left, leaving a strut of bone to keep the spinous  processes and interspinous ligaments intact,  and after placing trial spaces  on the right side of the midline, a thorough diskectomy was performed at  each of these levels on the left.  Subsequently, after trial sizing, a 10-mm  PEEK interbody PLIF cage was selected, packed with Osteocel and tamped into  position initially at the L4-5 level on the left, and then at the L3-4 level  on the left.  And, subsequently, similarly sized cages were placed on the  right, after the endplates were stripped of residual disk and cartilaginous  material.  Additional Osteocel was packed overlying these cages and between  them, and intraoperative fluoroscopy was then brought into the field, which  confirmed well-positioned interbody cages and bone graft.  Subsequently,  pedicle screws were placed; these were 6.5 x 45-mm screws at L4 and L5, and  6.5 x 50-mm screws at L3, and these were Alphatec  Zodiac screws.  All screws  had excellent purchase.  There were no cutouts, and their positioning was  confirmed on A/P and lateral fluoroscopy.  Then, 70-mm prelordosed rods were  fixed to the screw heads and these were torqued and positioned.  Prior to  doing so, the posterolateral region, which had been decorticated prior to  placing the screws, was then packed with the remaining morcellized bone  autograft, which was retained from the drilling of the laminae at the time  of the initial decompression, along with the remaining Osteocel and pieces  of bone, which had been cleared of investing soft tissue.  This was packed  bilaterally in the posterolateral regions from the L3 through L5 levels.  After the rods were placed and the screw heads torqued appropriately, the  self-retaining retractor was removed.  The nerve roots were inspected and  found to be in good repair with no evidence of any CSF leak.  The  lumbodorsal fascia was then closed with 1 Vicryl suture.  The subcutaneous  tissue was reapproximated with 2-0 Vicryl interrupted, inverted sutures, and  the skin edges were reapproximated with an inverted 3-0 Vicryl subcuticular  stitch.  The wound was dressed with benzoin, Steri-Strips, Telfa, gauze and  tape.  The patient was extubated in the operating room and taken to the  recovery room in stable, satisfactory condition, having tolerated his  operation well.  Counts were correct at the conclusion of the case.      Marchia Meiers. Vertell Limber, M.D.  Electronically Signed     JDS/MEDQ  D:  01/19/2006  T:  01/20/2006  Job:  [id number]   cc:   Ashok Pall, M.D.

## 2010-09-02 NOTE — Discharge Summary (Signed)
[name], [name] NO.:  [id number]   MEDICAL RECORD NO.:  [id number]          PATIENT TYPE:  INP   LOCATION:  3032                         FACILITY:  [facility]   PHYSICIAN:  Marchia Meiers. Vertell Limber, M.D.  DATE OF BIRTH:  06-19-1923   DATE OF ADMISSION:  01/19/2006  DATE OF DISCHARGE:  01/25/2006                               DISCHARGE SUMMARY   REASONS FOR ADMISSION:  1. Lumbar disk degeneration with lumbar spondylosis.  2. History of asthma without status asthmaticus.  3. Type 2 diabetes.  4. Lumbosacral spondylosis.  5. Coronary atherosclerosis status aortocoronary bypass.  6. Old myocardial infarction.  7. Emphysema.  8. Obesity.  9. History of tobacco use disorder.   FINAL DIAGNOSES:  1. Lumbar disk degeneration with lumbar spondylosis.  2. History of asthma without status asthmaticus.  3. Type 2 diabetes.  4. Lumbosacral spondylosis.  5. Coronary atherosclerosis status aortocoronary bypass.  6. Old myocardial infarction.  7. Emphysema.  8. Obesity.  9. History of tobacco use disorder.  10.Hypoxia requiring home oxygen.  11.Additionally lumbar scoliosis stenosis spondylolisthesis.   HISTORY OF ILLNESS AND HOSPITAL COURSE:  [name] is a 77-year-  old man with low back and bilateral lower extremity pain and weakness.  He was found to have spondylosis, stenosis, scoliosis and degenerative  disk disease with radiculopathy at the  L3-4 and L4-5 levels it was  elected to take him to surgery for decompression and fusion at these  affected levels.  The patient tolerated the procedure well.  Postoperatively, he was gradually mobilized and did have some right leg  pain when up.  He was given Decadron taper with improvement in these  symptoms he was felt to be in good enough shape to be able to go home  without requiring inpatient rehabilitation.  He was having some hypoxia  for which he was seen by pulmonary service and was started on Spiriva  and had a  room air ABG in respiratory to evaluate for respiratory failure.  He was  set up for outpatient pulmonary function tests.  He was doing better and  was discharged home with home oxygen per the pulmonary service with  instructions to follow-up in 2 weeks with pulmonary and Dr. Vertell Limber in 2-3  weeks in my office.      Marchia Meiers. Vertell Limber, M.D.  Electronically Signed     JDS/MEDQ  D:  04/05/2006  T:  04/06/2006  Job:  [id number]

## 2010-09-02 NOTE — Op Note (Signed)
NAME:  [name], [name] NO.:  [id number]   MEDICAL RECORD NO.:  [id number]                   PATIENT TYPE:  INP   LOCATION:  2314                                 FACILITY:  [facility]   PHYSICIAN:  Ivin Poot III, M.D.           DATE OF BIRTH:  April 28, 1923   DATE OF PROCEDURE:  02/09/2003  DATE OF DISCHARGE:                                 OPERATIVE REPORT   OPERATION:  Coronary artery bypass grafting x3 (left internal mammary artery  to left anterior descending, saphenous vein to diagonal, saphenous vein  graft to circumflex marginal).   PREOPERATIVE DIAGNOSIS:  Class 4 unstable angina with severe two-vessel  coronary artery disease.   POSTOPERATIVE DIAGNOSIS:  Class 4 unstable angina with severe two-vessel  coronary artery disease.   SURGEON:  Ivin Poot, M.D.   ASSISTANT:  Gilford Raid, M.D., Jobe Igo. Dub Mikes, P.A.-C., Mardene Celeste, N.P.-C   ANESTHESIA:  General by Glynda Jaeger, M.D.   INDICATIONS FOR PROCEDURE:  The patient is a 77 year old white male smoker  who presented with chest pain and ruled out for MI.  Cardiac catheterization  by Loretha Brasil. Lia Foyer, M.D. demonstrated a high grade long 95% stenosis of  the LAD as well as subtotal stenosis of a diagonal in the circumflex  marginal. He was felt to be a candidate for surgical coronary  vascularization and I saw the patient in his room following his cardiac  cath.  I discussed with the patient and family the indications and expected  benefits of coronary bypass surgery for treatment of his coronary artery  disease. I discussed the alternatives to surgical therapy for treatment of  his coronary disease as well. I reviewed with the patient the major aspects  of the proposed procedure including the choice of conduit for grafts, the  location of the surgical incisions, use of general anesthesia and  cardiopulmonary bypass, and the expected postoperative hospital recovery  period. I discussed with the patient the risks to him of heart surgery  including the risks of MI, CVA, bleeding, blood transfusion requirement,  infection, and death. He understood these implications for the surgery and  agreed to proceed with the operation as planned under what I felt was an  informed consent.   FINDINGS:  The patient's Foley placement was difficult due to a urethral  stricture which required Dr. Gaynelle Arabian of urology to do a cystoscopy and  placement of a urinary catheter. The patient's vein was suboptimal. The vein  exposed at the right knee was not adequate. The vein was harvested from the  right lower leg but then it became too small close to the knee. The vein was  exposed at the left ankle and was inadequate. The vein was then exposed in  the right groin and we found a short segment to use there. The mammary  artery was a good conduit with excellent flow.   DESCRIPTION  OF PROCEDURE:  The patient was brought to the operating room and  placed supine on the operating room table where general anesthesia was  induced under invasive hemodynamic monitoring. The chest, abdomen and legs  were prepped with Betadine and draped as a sterile field. A sternal incision  was made and the saphenous vein was harvested from the leg. The left  internal mammary artery was harvested as a pedicle graft from its origin at  the subclavian vessels and was a good vessel with excellent flow. Heparin  was administered and the ACT was documented as being therapeutic. The  sternal retractor was placed. Through pursestrings placed in the ascending  aorta and right atrium, the patient was cannulated and placed on a bypass  and cooled to 32 degrees. The coronaries were identified for grafting and  the mammary artery and vein grafts were prepared for the distal anastomoses.  A cardioplegia cannula was placed in the ascending aorta. The aortic cross  clamp was applied.  700 mL of cold blood  Cardioplegia was delivered to the  aortic root with good cardioplegic arrest and septal temperature dropping  less than 12 degrees. Topical ice saline slush was used to augment  myocardial preservation and a pericardial insulator pad was used to protect  the left phrenic nerve.   The distal coronary anastomoses were then performed. The first distal  anastomosis was the circumflex marginal. This was a 1.5 mm vessel with  proximal 90% stenosis. The reverse saphenous vein was sewn end to side with  running 7-0 Prolene with good flow through the graft. The second distal  anastomosis was to the diagonal. This was a 1.5 mm vessel with proximal 70%  stenosis. The reverse saphenous vein was sewn end to side with running 7-0  Prolene with good flow through the graft. Cardioplegia was redosed. The  third distal anastomosis was to the distal third of the LAD.  The LAD was  diffusely diseased and calcified. The mammary artery was sewn end to side  with a running 8-0 and there was excellent flow through the anastomosis with  immediate rise in septal temperature to 32 degrees after release of the  pedicle clamp on the mammary artery. The mammary pedicle was secured at the  epicardium, the aortic cross clamp was removed.   The heart resumed a spontaneous rhythm. Using a partial occluding clamp, two  proximal vein anastomoses were placed on the ascending aorta using a 4.0 mm  punch with running 6-0 Prolene. The partial clamp was removed and the main  grafts were perfused. All grafts had good flow and hemostasis was documented  to the proximal and distal sites. The patient was rewarmed to 37 degrees.  Temporary pacing wires were applied. The lungs were reexpanded and the  ventilator was resumed. The patient was weaned from bypass without  difficulty without requiring inotropes. Cardiac output and blood pressure  were stable. Protamine was administered without adverse reaction. The cannula was removed.  The mediastinum was irrigated with warm antibiotic  irrigation. The leg incision was irrigated and closed in a standard fashion.  The pericardium was loosely reapproximated. Two mediastinal and left  ___________  chest tubes were placed and brought out through separate  incisions. The sternum was closed with interrupted steel wire. The  pectoralis fascia was closed with a running #1 Vicryl. The subcutaneous and  skin layers were closed with a running Vicryl. Sterile dressings were  applied. Total bypass time was 130 minutes with a cross clamp  time of 60  minutes.                                               Len Childs, M.D.    PV/MEDQ  D:  02/09/2003  T:  02/10/2003  Job:  [id number]   cc:   [facility]

## 2011-05-16 DIAGNOSIS — R079 Chest pain, unspecified: Secondary | ICD-10-CM

## 2014-07-20 DIAGNOSIS — E1122 Type 2 diabetes mellitus with diabetic chronic kidney disease: Secondary | ICD-10-CM | POA: Diagnosis not present

## 2014-07-20 DIAGNOSIS — I10 Essential (primary) hypertension: Secondary | ICD-10-CM | POA: Diagnosis not present

## 2014-07-20 DIAGNOSIS — E782 Mixed hyperlipidemia: Secondary | ICD-10-CM | POA: Diagnosis not present

## 2014-07-27 DIAGNOSIS — Z1389 Encounter for screening for other disorder: Secondary | ICD-10-CM | POA: Diagnosis not present

## 2014-07-27 DIAGNOSIS — I10 Essential (primary) hypertension: Secondary | ICD-10-CM | POA: Diagnosis not present

## 2014-07-27 DIAGNOSIS — E782 Mixed hyperlipidemia: Secondary | ICD-10-CM | POA: Diagnosis not present

## 2014-07-27 DIAGNOSIS — Z23 Encounter for immunization: Secondary | ICD-10-CM | POA: Diagnosis not present

## 2014-07-27 DIAGNOSIS — E1122 Type 2 diabetes mellitus with diabetic chronic kidney disease: Secondary | ICD-10-CM | POA: Diagnosis not present

## 2014-07-27 DIAGNOSIS — J439 Emphysema, unspecified: Secondary | ICD-10-CM | POA: Diagnosis not present

## 2014-07-27 DIAGNOSIS — E1165 Type 2 diabetes mellitus with hyperglycemia: Secondary | ICD-10-CM | POA: Diagnosis not present

## 2015-01-19 DIAGNOSIS — E782 Mixed hyperlipidemia: Secondary | ICD-10-CM | POA: Diagnosis not present

## 2015-01-19 DIAGNOSIS — E1122 Type 2 diabetes mellitus with diabetic chronic kidney disease: Secondary | ICD-10-CM | POA: Diagnosis not present

## 2015-01-19 DIAGNOSIS — I10 Essential (primary) hypertension: Secondary | ICD-10-CM | POA: Diagnosis not present

## 2015-01-19 DIAGNOSIS — E1165 Type 2 diabetes mellitus with hyperglycemia: Secondary | ICD-10-CM | POA: Diagnosis not present

## 2015-01-26 DIAGNOSIS — E782 Mixed hyperlipidemia: Secondary | ICD-10-CM | POA: Diagnosis not present

## 2015-01-26 DIAGNOSIS — E786 Lipoprotein deficiency: Secondary | ICD-10-CM | POA: Diagnosis not present

## 2015-01-26 DIAGNOSIS — I10 Essential (primary) hypertension: Secondary | ICD-10-CM | POA: Diagnosis not present

## 2015-01-26 DIAGNOSIS — Z23 Encounter for immunization: Secondary | ICD-10-CM | POA: Diagnosis not present

## 2015-01-26 DIAGNOSIS — E1165 Type 2 diabetes mellitus with hyperglycemia: Secondary | ICD-10-CM | POA: Diagnosis not present

## 2015-04-20 DIAGNOSIS — E1165 Type 2 diabetes mellitus with hyperglycemia: Secondary | ICD-10-CM | POA: Diagnosis not present

## 2015-04-20 DIAGNOSIS — I10 Essential (primary) hypertension: Secondary | ICD-10-CM | POA: Diagnosis not present

## 2015-04-20 DIAGNOSIS — E782 Mixed hyperlipidemia: Secondary | ICD-10-CM | POA: Diagnosis not present

## 2015-04-27 DIAGNOSIS — E1165 Type 2 diabetes mellitus with hyperglycemia: Secondary | ICD-10-CM | POA: Diagnosis not present

## 2015-04-27 DIAGNOSIS — Z23 Encounter for immunization: Secondary | ICD-10-CM | POA: Diagnosis not present

## 2015-04-27 DIAGNOSIS — E875 Hyperkalemia: Secondary | ICD-10-CM | POA: Diagnosis not present

## 2015-04-27 DIAGNOSIS — E782 Mixed hyperlipidemia: Secondary | ICD-10-CM | POA: Diagnosis not present

## 2015-04-27 DIAGNOSIS — I10 Essential (primary) hypertension: Secondary | ICD-10-CM | POA: Diagnosis not present

## 2015-07-23 DIAGNOSIS — E782 Mixed hyperlipidemia: Secondary | ICD-10-CM | POA: Diagnosis not present

## 2015-07-23 DIAGNOSIS — E786 Lipoprotein deficiency: Secondary | ICD-10-CM | POA: Diagnosis not present

## 2015-07-23 DIAGNOSIS — E875 Hyperkalemia: Secondary | ICD-10-CM | POA: Diagnosis not present

## 2015-07-23 DIAGNOSIS — I10 Essential (primary) hypertension: Secondary | ICD-10-CM | POA: Diagnosis not present

## 2015-07-23 DIAGNOSIS — E1122 Type 2 diabetes mellitus with diabetic chronic kidney disease: Secondary | ICD-10-CM | POA: Diagnosis not present

## 2015-07-26 DIAGNOSIS — E786 Lipoprotein deficiency: Secondary | ICD-10-CM | POA: Diagnosis not present

## 2015-07-26 DIAGNOSIS — I10 Essential (primary) hypertension: Secondary | ICD-10-CM | POA: Diagnosis not present

## 2015-07-26 DIAGNOSIS — E875 Hyperkalemia: Secondary | ICD-10-CM | POA: Diagnosis not present

## 2015-07-26 DIAGNOSIS — E782 Mixed hyperlipidemia: Secondary | ICD-10-CM | POA: Diagnosis not present

## 2015-07-26 DIAGNOSIS — E1165 Type 2 diabetes mellitus with hyperglycemia: Secondary | ICD-10-CM | POA: Diagnosis not present

## 2015-09-17 DIAGNOSIS — J159 Unspecified bacterial pneumonia: Secondary | ICD-10-CM | POA: Diagnosis not present

## 2015-10-22 DIAGNOSIS — E786 Lipoprotein deficiency: Secondary | ICD-10-CM | POA: Diagnosis not present

## 2015-10-22 DIAGNOSIS — E1165 Type 2 diabetes mellitus with hyperglycemia: Secondary | ICD-10-CM | POA: Diagnosis not present

## 2015-10-22 DIAGNOSIS — E782 Mixed hyperlipidemia: Secondary | ICD-10-CM | POA: Diagnosis not present

## 2015-10-22 DIAGNOSIS — E875 Hyperkalemia: Secondary | ICD-10-CM | POA: Diagnosis not present

## 2015-10-25 DIAGNOSIS — E782 Mixed hyperlipidemia: Secondary | ICD-10-CM | POA: Diagnosis not present

## 2015-10-25 DIAGNOSIS — I10 Essential (primary) hypertension: Secondary | ICD-10-CM | POA: Diagnosis not present

## 2015-10-25 DIAGNOSIS — E875 Hyperkalemia: Secondary | ICD-10-CM | POA: Diagnosis not present

## 2015-10-25 DIAGNOSIS — E1165 Type 2 diabetes mellitus with hyperglycemia: Secondary | ICD-10-CM | POA: Diagnosis not present

## 2015-11-15 DIAGNOSIS — E782 Mixed hyperlipidemia: Secondary | ICD-10-CM | POA: Diagnosis not present

## 2015-11-15 DIAGNOSIS — J441 Chronic obstructive pulmonary disease with (acute) exacerbation: Secondary | ICD-10-CM | POA: Diagnosis not present

## 2015-11-15 DIAGNOSIS — E1165 Type 2 diabetes mellitus with hyperglycemia: Secondary | ICD-10-CM | POA: Diagnosis not present

## 2015-11-15 DIAGNOSIS — I10 Essential (primary) hypertension: Secondary | ICD-10-CM | POA: Diagnosis not present

## 2015-11-15 DIAGNOSIS — J439 Emphysema, unspecified: Secondary | ICD-10-CM | POA: Diagnosis not present

## 2015-11-17 DIAGNOSIS — E1165 Type 2 diabetes mellitus with hyperglycemia: Secondary | ICD-10-CM | POA: Diagnosis not present

## 2015-11-17 DIAGNOSIS — J441 Chronic obstructive pulmonary disease with (acute) exacerbation: Secondary | ICD-10-CM | POA: Diagnosis not present

## 2015-11-17 DIAGNOSIS — I10 Essential (primary) hypertension: Secondary | ICD-10-CM | POA: Diagnosis not present

## 2015-11-17 DIAGNOSIS — E782 Mixed hyperlipidemia: Secondary | ICD-10-CM | POA: Diagnosis not present

## 2016-01-19 DIAGNOSIS — E875 Hyperkalemia: Secondary | ICD-10-CM | POA: Diagnosis not present

## 2016-01-19 DIAGNOSIS — E786 Lipoprotein deficiency: Secondary | ICD-10-CM | POA: Diagnosis not present

## 2016-01-19 DIAGNOSIS — E1165 Type 2 diabetes mellitus with hyperglycemia: Secondary | ICD-10-CM | POA: Diagnosis not present

## 2016-01-19 DIAGNOSIS — E782 Mixed hyperlipidemia: Secondary | ICD-10-CM | POA: Diagnosis not present

## 2016-01-24 DIAGNOSIS — J439 Emphysema, unspecified: Secondary | ICD-10-CM | POA: Diagnosis not present

## 2016-01-24 DIAGNOSIS — E1165 Type 2 diabetes mellitus with hyperglycemia: Secondary | ICD-10-CM | POA: Diagnosis not present

## 2016-01-24 DIAGNOSIS — I10 Essential (primary) hypertension: Secondary | ICD-10-CM | POA: Diagnosis not present

## 2016-01-24 DIAGNOSIS — E782 Mixed hyperlipidemia: Secondary | ICD-10-CM | POA: Diagnosis not present

## 2016-01-24 DIAGNOSIS — E875 Hyperkalemia: Secondary | ICD-10-CM | POA: Diagnosis not present

## 2016-01-24 DIAGNOSIS — Z23 Encounter for immunization: Secondary | ICD-10-CM | POA: Diagnosis not present

## 2016-04-20 DIAGNOSIS — N184 Chronic kidney disease, stage 4 (severe): Secondary | ICD-10-CM | POA: Diagnosis not present

## 2016-04-20 DIAGNOSIS — I10 Essential (primary) hypertension: Secondary | ICD-10-CM | POA: Diagnosis not present

## 2016-04-20 DIAGNOSIS — E1165 Type 2 diabetes mellitus with hyperglycemia: Secondary | ICD-10-CM | POA: Diagnosis not present

## 2016-04-20 DIAGNOSIS — E782 Mixed hyperlipidemia: Secondary | ICD-10-CM | POA: Diagnosis not present

## 2016-04-26 DIAGNOSIS — J439 Emphysema, unspecified: Secondary | ICD-10-CM | POA: Diagnosis not present

## 2016-04-26 DIAGNOSIS — E1122 Type 2 diabetes mellitus with diabetic chronic kidney disease: Secondary | ICD-10-CM | POA: Diagnosis not present

## 2016-04-26 DIAGNOSIS — E782 Mixed hyperlipidemia: Secondary | ICD-10-CM | POA: Diagnosis not present

## 2016-04-26 DIAGNOSIS — I10 Essential (primary) hypertension: Secondary | ICD-10-CM | POA: Diagnosis not present

## 2016-04-26 DIAGNOSIS — E1165 Type 2 diabetes mellitus with hyperglycemia: Secondary | ICD-10-CM | POA: Diagnosis not present

## 2016-07-25 DIAGNOSIS — E1165 Type 2 diabetes mellitus with hyperglycemia: Secondary | ICD-10-CM | POA: Diagnosis not present

## 2016-07-25 DIAGNOSIS — E786 Lipoprotein deficiency: Secondary | ICD-10-CM | POA: Diagnosis not present

## 2016-07-25 DIAGNOSIS — E782 Mixed hyperlipidemia: Secondary | ICD-10-CM | POA: Diagnosis not present

## 2016-07-25 DIAGNOSIS — E875 Hyperkalemia: Secondary | ICD-10-CM | POA: Diagnosis not present

## 2016-07-27 DIAGNOSIS — E1165 Type 2 diabetes mellitus with hyperglycemia: Secondary | ICD-10-CM | POA: Diagnosis not present

## 2016-07-27 DIAGNOSIS — I10 Essential (primary) hypertension: Secondary | ICD-10-CM | POA: Diagnosis not present

## 2016-07-27 DIAGNOSIS — E875 Hyperkalemia: Secondary | ICD-10-CM | POA: Diagnosis not present

## 2016-07-27 DIAGNOSIS — E782 Mixed hyperlipidemia: Secondary | ICD-10-CM | POA: Diagnosis not present

## 2016-10-16 DIAGNOSIS — E875 Hyperkalemia: Secondary | ICD-10-CM | POA: Diagnosis not present

## 2016-10-16 DIAGNOSIS — E782 Mixed hyperlipidemia: Secondary | ICD-10-CM | POA: Diagnosis not present

## 2016-10-16 DIAGNOSIS — E1165 Type 2 diabetes mellitus with hyperglycemia: Secondary | ICD-10-CM | POA: Diagnosis not present

## 2016-10-16 DIAGNOSIS — I10 Essential (primary) hypertension: Secondary | ICD-10-CM | POA: Diagnosis not present

## 2016-10-19 DIAGNOSIS — E1165 Type 2 diabetes mellitus with hyperglycemia: Secondary | ICD-10-CM | POA: Diagnosis not present

## 2016-10-19 DIAGNOSIS — E875 Hyperkalemia: Secondary | ICD-10-CM | POA: Diagnosis not present

## 2016-10-19 DIAGNOSIS — E782 Mixed hyperlipidemia: Secondary | ICD-10-CM | POA: Diagnosis not present

## 2016-10-19 DIAGNOSIS — I10 Essential (primary) hypertension: Secondary | ICD-10-CM | POA: Diagnosis not present

## 2017-02-20 DIAGNOSIS — E875 Hyperkalemia: Secondary | ICD-10-CM | POA: Diagnosis not present

## 2017-02-20 DIAGNOSIS — I10 Essential (primary) hypertension: Secondary | ICD-10-CM | POA: Diagnosis not present

## 2017-02-20 DIAGNOSIS — E782 Mixed hyperlipidemia: Secondary | ICD-10-CM | POA: Diagnosis not present

## 2017-02-20 DIAGNOSIS — E1165 Type 2 diabetes mellitus with hyperglycemia: Secondary | ICD-10-CM | POA: Diagnosis not present

## 2017-02-23 DIAGNOSIS — J439 Emphysema, unspecified: Secondary | ICD-10-CM | POA: Diagnosis not present

## 2017-02-23 DIAGNOSIS — E786 Lipoprotein deficiency: Secondary | ICD-10-CM | POA: Diagnosis not present

## 2017-02-23 DIAGNOSIS — Z23 Encounter for immunization: Secondary | ICD-10-CM | POA: Diagnosis not present

## 2017-02-23 DIAGNOSIS — E1165 Type 2 diabetes mellitus with hyperglycemia: Secondary | ICD-10-CM | POA: Diagnosis not present

## 2017-02-23 DIAGNOSIS — Z1389 Encounter for screening for other disorder: Secondary | ICD-10-CM | POA: Diagnosis not present

## 2017-02-23 DIAGNOSIS — I10 Essential (primary) hypertension: Secondary | ICD-10-CM | POA: Diagnosis not present

## 2017-02-23 DIAGNOSIS — N184 Chronic kidney disease, stage 4 (severe): Secondary | ICD-10-CM | POA: Diagnosis not present

## 2017-05-18 DIAGNOSIS — E1165 Type 2 diabetes mellitus with hyperglycemia: Secondary | ICD-10-CM | POA: Diagnosis not present

## 2017-05-18 DIAGNOSIS — I10 Essential (primary) hypertension: Secondary | ICD-10-CM | POA: Diagnosis not present

## 2017-05-18 DIAGNOSIS — E782 Mixed hyperlipidemia: Secondary | ICD-10-CM | POA: Diagnosis not present

## 2017-05-18 DIAGNOSIS — E875 Hyperkalemia: Secondary | ICD-10-CM | POA: Diagnosis not present

## 2017-05-24 DIAGNOSIS — J439 Emphysema, unspecified: Secondary | ICD-10-CM | POA: Diagnosis not present

## 2017-05-24 DIAGNOSIS — E1165 Type 2 diabetes mellitus with hyperglycemia: Secondary | ICD-10-CM | POA: Diagnosis not present

## 2017-05-24 DIAGNOSIS — E782 Mixed hyperlipidemia: Secondary | ICD-10-CM | POA: Diagnosis not present

## 2017-05-24 DIAGNOSIS — I10 Essential (primary) hypertension: Secondary | ICD-10-CM | POA: Diagnosis not present

## 2017-05-24 DIAGNOSIS — E875 Hyperkalemia: Secondary | ICD-10-CM | POA: Diagnosis not present

## 2017-12-06 DIAGNOSIS — I10 Essential (primary) hypertension: Secondary | ICD-10-CM | POA: Diagnosis not present

## 2017-12-06 DIAGNOSIS — J441 Chronic obstructive pulmonary disease with (acute) exacerbation: Secondary | ICD-10-CM | POA: Diagnosis not present

## 2017-12-06 DIAGNOSIS — E1122 Type 2 diabetes mellitus with diabetic chronic kidney disease: Secondary | ICD-10-CM | POA: Diagnosis not present

## 2017-12-06 DIAGNOSIS — J439 Emphysema, unspecified: Secondary | ICD-10-CM | POA: Diagnosis not present

## 2017-12-06 DIAGNOSIS — N184 Chronic kidney disease, stage 4 (severe): Secondary | ICD-10-CM | POA: Diagnosis not present

## 2018-01-29 ENCOUNTER — Other Ambulatory Visit: Payer: Self-pay

## 2018-01-29 NOTE — Patient Outreach (Signed)
[name] [facility]) Care Management  01/29/2018  [name] 1923-08-10 [id number]   Medication Adherence call to [name] spoke with patient he ask if we can call [name] and order Glipizide 10 mg for him and he will pick up once is ready from the pharmacy . [facility] will have it ready for patient to pick up. [name] is showing past due under [place].  [facility] Direct Dial [phone]  Fax [phone] [email or web address]

## 2018-04-11 DIAGNOSIS — J441 Chronic obstructive pulmonary disease with (acute) exacerbation: Secondary | ICD-10-CM | POA: Diagnosis not present

## 2018-04-11 DIAGNOSIS — N183 Chronic kidney disease, stage 3 (moderate): Secondary | ICD-10-CM | POA: Diagnosis not present

## 2018-04-11 DIAGNOSIS — Z23 Encounter for immunization: Secondary | ICD-10-CM | POA: Diagnosis not present

## 2018-04-11 DIAGNOSIS — I10 Essential (primary) hypertension: Secondary | ICD-10-CM | POA: Diagnosis not present

## 2018-04-11 DIAGNOSIS — E782 Mixed hyperlipidemia: Secondary | ICD-10-CM | POA: Diagnosis not present

## 2018-06-24 DIAGNOSIS — I10 Essential (primary) hypertension: Secondary | ICD-10-CM | POA: Diagnosis not present

## 2018-06-24 DIAGNOSIS — N184 Chronic kidney disease, stage 4 (severe): Secondary | ICD-10-CM | POA: Diagnosis not present

## 2018-06-24 DIAGNOSIS — E782 Mixed hyperlipidemia: Secondary | ICD-10-CM | POA: Diagnosis not present

## 2018-06-24 DIAGNOSIS — E875 Hyperkalemia: Secondary | ICD-10-CM | POA: Diagnosis not present

## 2018-06-24 DIAGNOSIS — E1122 Type 2 diabetes mellitus with diabetic chronic kidney disease: Secondary | ICD-10-CM | POA: Diagnosis not present

## 2018-06-27 DIAGNOSIS — I10 Essential (primary) hypertension: Secondary | ICD-10-CM | POA: Diagnosis not present

## 2018-06-27 DIAGNOSIS — J439 Emphysema, unspecified: Secondary | ICD-10-CM | POA: Diagnosis not present

## 2018-06-27 DIAGNOSIS — E1122 Type 2 diabetes mellitus with diabetic chronic kidney disease: Secondary | ICD-10-CM | POA: Diagnosis not present

## 2018-06-27 DIAGNOSIS — E875 Hyperkalemia: Secondary | ICD-10-CM | POA: Diagnosis not present

## 2018-06-27 DIAGNOSIS — E782 Mixed hyperlipidemia: Secondary | ICD-10-CM | POA: Diagnosis not present

## 2018-06-27 DIAGNOSIS — M545 Low back pain: Secondary | ICD-10-CM | POA: Diagnosis not present

## 2018-07-15 ENCOUNTER — Telehealth: Payer: Self-pay | Admitting: Cardiology

## 2018-07-15 NOTE — Telephone Encounter (Signed)
Dr. Pleas Koch called the [facility] office requesting to speak with Cardiology in regards to a "general question about patient's medication" He states that it is not urgent just wanted to verify with cardiology.  Requested to be called on cell # [phone].

## 2018-07-15 NOTE — Telephone Encounter (Signed)
I returned his call

## 2018-10-15 DIAGNOSIS — I10 Essential (primary) hypertension: Secondary | ICD-10-CM | POA: Diagnosis not present

## 2018-10-15 DIAGNOSIS — E7800 Pure hypercholesterolemia, unspecified: Secondary | ICD-10-CM | POA: Diagnosis not present

## 2018-10-15 DIAGNOSIS — E1165 Type 2 diabetes mellitus with hyperglycemia: Secondary | ICD-10-CM | POA: Diagnosis not present

## 2018-11-15 DIAGNOSIS — E782 Mixed hyperlipidemia: Secondary | ICD-10-CM | POA: Diagnosis not present

## 2018-11-15 DIAGNOSIS — I10 Essential (primary) hypertension: Secondary | ICD-10-CM | POA: Diagnosis not present

## 2018-12-16 DIAGNOSIS — I10 Essential (primary) hypertension: Secondary | ICD-10-CM | POA: Diagnosis not present

## 2018-12-16 DIAGNOSIS — E1165 Type 2 diabetes mellitus with hyperglycemia: Secondary | ICD-10-CM | POA: Diagnosis not present

## 2018-12-16 DIAGNOSIS — E782 Mixed hyperlipidemia: Secondary | ICD-10-CM | POA: Diagnosis not present

## 2018-12-25 DIAGNOSIS — E875 Hyperkalemia: Secondary | ICD-10-CM | POA: Diagnosis not present

## 2018-12-25 DIAGNOSIS — N184 Chronic kidney disease, stage 4 (severe): Secondary | ICD-10-CM | POA: Diagnosis not present

## 2018-12-25 DIAGNOSIS — I10 Essential (primary) hypertension: Secondary | ICD-10-CM | POA: Diagnosis not present

## 2018-12-25 DIAGNOSIS — E1122 Type 2 diabetes mellitus with diabetic chronic kidney disease: Secondary | ICD-10-CM | POA: Diagnosis not present

## 2019-01-02 DIAGNOSIS — Z6824 Body mass index (BMI) 24.0-24.9, adult: Secondary | ICD-10-CM | POA: Diagnosis not present

## 2019-01-02 DIAGNOSIS — I10 Essential (primary) hypertension: Secondary | ICD-10-CM | POA: Diagnosis not present

## 2019-01-02 DIAGNOSIS — E1122 Type 2 diabetes mellitus with diabetic chronic kidney disease: Secondary | ICD-10-CM | POA: Diagnosis not present

## 2019-01-02 DIAGNOSIS — Z0001 Encounter for general adult medical examination with abnormal findings: Secondary | ICD-10-CM | POA: Diagnosis not present

## 2019-01-02 DIAGNOSIS — J439 Emphysema, unspecified: Secondary | ICD-10-CM | POA: Diagnosis not present

## 2019-01-02 DIAGNOSIS — E782 Mixed hyperlipidemia: Secondary | ICD-10-CM | POA: Diagnosis not present

## 2019-01-02 DIAGNOSIS — Z23 Encounter for immunization: Secondary | ICD-10-CM | POA: Diagnosis not present

## 2019-01-15 DIAGNOSIS — I10 Essential (primary) hypertension: Secondary | ICD-10-CM | POA: Diagnosis not present

## 2019-01-15 DIAGNOSIS — E1165 Type 2 diabetes mellitus with hyperglycemia: Secondary | ICD-10-CM | POA: Diagnosis not present

## 2019-02-06 ENCOUNTER — Other Ambulatory Visit: Payer: Self-pay

## 2019-02-06 NOTE — Patient Outreach (Signed)
[facility]) Care Management  02/06/2019  [name] 05/06/1923 [id number]   Medication Adherence call to Mr. [name] Telephone call to Patient regarding Medication Adherence unable to reach patient.patient did not answer he is past due on Atorvastatin 40 mg and Glipizide 10 mg under [place].   [facility] Management Direct Dial [phone]  Fax [phone] [email or web address]

## 2019-02-14 DIAGNOSIS — E782 Mixed hyperlipidemia: Secondary | ICD-10-CM | POA: Diagnosis not present

## 2019-02-14 DIAGNOSIS — I10 Essential (primary) hypertension: Secondary | ICD-10-CM | POA: Diagnosis not present

## 2019-02-19 ENCOUNTER — Other Ambulatory Visit: Payer: Self-pay

## 2019-02-19 NOTE — Patient Outreach (Signed)
[facility]) Care Management  02/19/2019  AZIM GILLINGHAM [birth year] [id number]   Medication Adherence call to Mr. Jemiah Ellen[place] Telephone call to Patient regarding Medication Adherence unable to reach patient. Mr. Cho is showing past due on Atorvastatin 40 mg and Glipizide 10 mg under [place].   [facility] Management Direct Dial [phone]  Fax [phone] [email or web address]

## 2019-03-12 DIAGNOSIS — K8689 Other specified diseases of pancreas: Secondary | ICD-10-CM | POA: Diagnosis not present

## 2019-03-12 DIAGNOSIS — R0600 Dyspnea, unspecified: Secondary | ICD-10-CM | POA: Diagnosis not present

## 2019-03-12 DIAGNOSIS — K4020 Bilateral inguinal hernia, without obstruction or gangrene, not specified as recurrent: Secondary | ICD-10-CM | POA: Diagnosis not present

## 2019-03-12 DIAGNOSIS — A419 Sepsis, unspecified organism: Secondary | ICD-10-CM | POA: Diagnosis not present

## 2019-03-12 DIAGNOSIS — R531 Weakness: Secondary | ICD-10-CM | POA: Diagnosis not present

## 2019-03-12 DIAGNOSIS — K5792 Diverticulitis of intestine, part unspecified, without perforation or abscess without bleeding: Secondary | ICD-10-CM | POA: Diagnosis not present

## 2019-03-12 DIAGNOSIS — R0602 Shortness of breath: Secondary | ICD-10-CM | POA: Diagnosis not present

## 2019-03-12 DIAGNOSIS — R5383 Other fatigue: Secondary | ICD-10-CM | POA: Diagnosis not present

## 2019-03-12 DIAGNOSIS — K3189 Other diseases of stomach and duodenum: Secondary | ICD-10-CM | POA: Diagnosis not present

## 2019-03-12 DIAGNOSIS — K5732 Diverticulitis of large intestine without perforation or abscess without bleeding: Secondary | ICD-10-CM | POA: Diagnosis not present

## 2019-03-12 DIAGNOSIS — N19 Unspecified kidney failure: Secondary | ICD-10-CM | POA: Diagnosis not present

## 2019-03-13 DIAGNOSIS — N183 Chronic kidney disease, stage 3 unspecified: Secondary | ICD-10-CM | POA: Diagnosis not present

## 2019-03-13 DIAGNOSIS — K311 Adult hypertrophic pyloric stenosis: Secondary | ICD-10-CM | POA: Diagnosis not present

## 2019-03-13 DIAGNOSIS — K3189 Other diseases of stomach and duodenum: Secondary | ICD-10-CM | POA: Diagnosis not present

## 2019-03-13 DIAGNOSIS — K219 Gastro-esophageal reflux disease without esophagitis: Secondary | ICD-10-CM | POA: Diagnosis not present

## 2019-03-13 DIAGNOSIS — Z4682 Encounter for fitting and adjustment of non-vascular catheter: Secondary | ICD-10-CM | POA: Diagnosis not present

## 2019-03-13 DIAGNOSIS — N179 Acute kidney failure, unspecified: Secondary | ICD-10-CM | POA: Diagnosis not present

## 2019-03-13 DIAGNOSIS — K259 Gastric ulcer, unspecified as acute or chronic, without hemorrhage or perforation: Secondary | ICD-10-CM | POA: Diagnosis not present

## 2019-03-13 DIAGNOSIS — I2510 Atherosclerotic heart disease of native coronary artery without angina pectoris: Secondary | ICD-10-CM | POA: Diagnosis not present

## 2019-03-13 DIAGNOSIS — A419 Sepsis, unspecified organism: Secondary | ICD-10-CM | POA: Diagnosis not present

## 2019-03-13 DIAGNOSIS — E860 Dehydration: Secondary | ICD-10-CM | POA: Diagnosis not present

## 2019-03-13 DIAGNOSIS — R0600 Dyspnea, unspecified: Secondary | ICD-10-CM | POA: Diagnosis not present

## 2019-03-13 DIAGNOSIS — K562 Volvulus: Secondary | ICD-10-CM | POA: Diagnosis not present

## 2019-03-13 DIAGNOSIS — R933 Abnormal findings on diagnostic imaging of other parts of digestive tract: Secondary | ICD-10-CM | POA: Diagnosis not present

## 2019-03-13 DIAGNOSIS — K5792 Diverticulitis of intestine, part unspecified, without perforation or abscess without bleeding: Secondary | ICD-10-CM | POA: Diagnosis not present

## 2019-03-13 DIAGNOSIS — K5732 Diverticulitis of large intestine without perforation or abscess without bleeding: Secondary | ICD-10-CM | POA: Diagnosis not present

## 2019-03-13 DIAGNOSIS — N19 Unspecified kidney failure: Secondary | ICD-10-CM | POA: Diagnosis not present

## 2019-03-13 DIAGNOSIS — J449 Chronic obstructive pulmonary disease, unspecified: Secondary | ICD-10-CM | POA: Diagnosis not present

## 2019-03-13 DIAGNOSIS — K4020 Bilateral inguinal hernia, without obstruction or gangrene, not specified as recurrent: Secondary | ICD-10-CM | POA: Diagnosis not present

## 2019-03-13 DIAGNOSIS — E1122 Type 2 diabetes mellitus with diabetic chronic kidney disease: Secondary | ICD-10-CM | POA: Diagnosis not present

## 2019-03-13 DIAGNOSIS — K8689 Other specified diseases of pancreas: Secondary | ICD-10-CM | POA: Diagnosis not present

## 2019-03-13 DIAGNOSIS — R5383 Other fatigue: Secondary | ICD-10-CM | POA: Diagnosis not present

## 2019-03-13 DIAGNOSIS — J9811 Atelectasis: Secondary | ICD-10-CM | POA: Diagnosis not present

## 2019-03-13 DIAGNOSIS — R531 Weakness: Secondary | ICD-10-CM | POA: Diagnosis not present

## 2019-03-13 DIAGNOSIS — Z8249 Family history of ischemic heart disease and other diseases of the circulatory system: Secondary | ICD-10-CM | POA: Diagnosis not present

## 2019-03-13 DIAGNOSIS — K2970 Gastritis, unspecified, without bleeding: Secondary | ICD-10-CM | POA: Diagnosis not present

## 2019-03-13 DIAGNOSIS — I129 Hypertensive chronic kidney disease with stage 1 through stage 4 chronic kidney disease, or unspecified chronic kidney disease: Secondary | ICD-10-CM | POA: Diagnosis not present

## 2019-03-13 DIAGNOSIS — Z951 Presence of aortocoronary bypass graft: Secondary | ICD-10-CM | POA: Diagnosis not present

## 2019-03-13 DIAGNOSIS — N189 Chronic kidney disease, unspecified: Secondary | ICD-10-CM | POA: Diagnosis not present

## 2019-03-17 DIAGNOSIS — E1165 Type 2 diabetes mellitus with hyperglycemia: Secondary | ICD-10-CM | POA: Diagnosis not present

## 2019-03-17 DIAGNOSIS — E782 Mixed hyperlipidemia: Secondary | ICD-10-CM | POA: Diagnosis not present

## 2019-03-28 DIAGNOSIS — E1165 Type 2 diabetes mellitus with hyperglycemia: Secondary | ICD-10-CM | POA: Diagnosis not present

## 2019-03-28 DIAGNOSIS — E875 Hyperkalemia: Secondary | ICD-10-CM | POA: Diagnosis not present

## 2019-03-28 DIAGNOSIS — E1122 Type 2 diabetes mellitus with diabetic chronic kidney disease: Secondary | ICD-10-CM | POA: Diagnosis not present

## 2019-03-28 DIAGNOSIS — E782 Mixed hyperlipidemia: Secondary | ICD-10-CM | POA: Diagnosis not present

## 2019-03-28 DIAGNOSIS — I10 Essential (primary) hypertension: Secondary | ICD-10-CM | POA: Diagnosis not present

## 2019-04-01 DIAGNOSIS — E1122 Type 2 diabetes mellitus with diabetic chronic kidney disease: Secondary | ICD-10-CM | POA: Diagnosis not present

## 2019-04-01 DIAGNOSIS — N179 Acute kidney failure, unspecified: Secondary | ICD-10-CM | POA: Diagnosis not present

## 2019-04-01 DIAGNOSIS — E782 Mixed hyperlipidemia: Secondary | ICD-10-CM | POA: Diagnosis not present

## 2019-04-01 DIAGNOSIS — I10 Essential (primary) hypertension: Secondary | ICD-10-CM | POA: Diagnosis not present

## 2019-04-17 DIAGNOSIS — I10 Essential (primary) hypertension: Secondary | ICD-10-CM | POA: Diagnosis not present

## 2019-04-17 DIAGNOSIS — E782 Mixed hyperlipidemia: Secondary | ICD-10-CM | POA: Diagnosis not present

## 2019-05-16 DIAGNOSIS — E7849 Other hyperlipidemia: Secondary | ICD-10-CM | POA: Diagnosis not present

## 2019-05-16 DIAGNOSIS — I10 Essential (primary) hypertension: Secondary | ICD-10-CM | POA: Diagnosis not present

## 2019-06-13 DIAGNOSIS — E7849 Other hyperlipidemia: Secondary | ICD-10-CM | POA: Diagnosis not present

## 2019-06-13 DIAGNOSIS — I10 Essential (primary) hypertension: Secondary | ICD-10-CM | POA: Diagnosis not present

## 2019-06-23 DIAGNOSIS — I10 Essential (primary) hypertension: Secondary | ICD-10-CM | POA: Diagnosis not present

## 2019-06-23 DIAGNOSIS — M1991 Primary osteoarthritis, unspecified site: Secondary | ICD-10-CM | POA: Diagnosis not present

## 2019-06-23 DIAGNOSIS — E782 Mixed hyperlipidemia: Secondary | ICD-10-CM | POA: Diagnosis not present

## 2019-06-23 DIAGNOSIS — E1122 Type 2 diabetes mellitus with diabetic chronic kidney disease: Secondary | ICD-10-CM | POA: Diagnosis not present

## 2019-06-23 DIAGNOSIS — E1165 Type 2 diabetes mellitus with hyperglycemia: Secondary | ICD-10-CM | POA: Diagnosis not present

## 2019-06-26 DIAGNOSIS — N183 Chronic kidney disease, stage 3 unspecified: Secondary | ICD-10-CM | POA: Diagnosis not present

## 2019-06-26 DIAGNOSIS — E1122 Type 2 diabetes mellitus with diabetic chronic kidney disease: Secondary | ICD-10-CM | POA: Diagnosis not present

## 2019-06-26 DIAGNOSIS — E875 Hyperkalemia: Secondary | ICD-10-CM | POA: Diagnosis not present

## 2019-06-26 DIAGNOSIS — E782 Mixed hyperlipidemia: Secondary | ICD-10-CM | POA: Diagnosis not present

## 2019-07-10 DIAGNOSIS — E782 Mixed hyperlipidemia: Secondary | ICD-10-CM | POA: Diagnosis not present

## 2019-07-10 DIAGNOSIS — E1122 Type 2 diabetes mellitus with diabetic chronic kidney disease: Secondary | ICD-10-CM | POA: Diagnosis not present

## 2019-07-10 DIAGNOSIS — E875 Hyperkalemia: Secondary | ICD-10-CM | POA: Diagnosis not present

## 2019-07-10 DIAGNOSIS — I10 Essential (primary) hypertension: Secondary | ICD-10-CM | POA: Diagnosis not present

## 2019-07-16 DIAGNOSIS — I10 Essential (primary) hypertension: Secondary | ICD-10-CM | POA: Diagnosis not present

## 2019-07-16 DIAGNOSIS — E1165 Type 2 diabetes mellitus with hyperglycemia: Secondary | ICD-10-CM | POA: Diagnosis not present

## 2019-08-12 DIAGNOSIS — M1991 Primary osteoarthritis, unspecified site: Secondary | ICD-10-CM | POA: Diagnosis not present

## 2019-08-12 DIAGNOSIS — H9190 Unspecified hearing loss, unspecified ear: Secondary | ICD-10-CM | POA: Diagnosis not present

## 2019-08-12 DIAGNOSIS — E785 Hyperlipidemia, unspecified: Secondary | ICD-10-CM | POA: Diagnosis not present

## 2019-08-12 DIAGNOSIS — Z7984 Long term (current) use of oral hypoglycemic drugs: Secondary | ICD-10-CM | POA: Diagnosis not present

## 2019-08-12 DIAGNOSIS — D62 Acute posthemorrhagic anemia: Secondary | ICD-10-CM | POA: Diagnosis not present

## 2019-08-12 DIAGNOSIS — R410 Disorientation, unspecified: Secondary | ICD-10-CM | POA: Diagnosis not present

## 2019-08-12 DIAGNOSIS — I808 Phlebitis and thrombophlebitis of other sites: Secondary | ICD-10-CM | POA: Diagnosis not present

## 2019-08-12 DIAGNOSIS — R54 Age-related physical debility: Secondary | ICD-10-CM | POA: Diagnosis not present

## 2019-08-12 DIAGNOSIS — R0902 Hypoxemia: Secondary | ICD-10-CM | POA: Diagnosis not present

## 2019-08-12 DIAGNOSIS — E119 Type 2 diabetes mellitus without complications: Secondary | ICD-10-CM | POA: Diagnosis not present

## 2019-08-12 DIAGNOSIS — I2510 Atherosclerotic heart disease of native coronary artery without angina pectoris: Secondary | ICD-10-CM | POA: Diagnosis not present

## 2019-08-12 DIAGNOSIS — M1990 Unspecified osteoarthritis, unspecified site: Secondary | ICD-10-CM | POA: Diagnosis not present

## 2019-08-12 DIAGNOSIS — S199XXA Unspecified injury of neck, initial encounter: Secondary | ICD-10-CM | POA: Diagnosis not present

## 2019-08-12 DIAGNOSIS — D649 Anemia, unspecified: Secondary | ICD-10-CM | POA: Diagnosis not present

## 2019-08-12 DIAGNOSIS — K921 Melena: Secondary | ICD-10-CM | POA: Diagnosis not present

## 2019-08-12 DIAGNOSIS — R69 Illness, unspecified: Secondary | ICD-10-CM | POA: Diagnosis not present

## 2019-08-12 DIAGNOSIS — U071 COVID-19: Secondary | ICD-10-CM | POA: Diagnosis not present

## 2019-08-12 DIAGNOSIS — D500 Iron deficiency anemia secondary to blood loss (chronic): Secondary | ICD-10-CM | POA: Diagnosis not present

## 2019-08-12 DIAGNOSIS — S299XXA Unspecified injury of thorax, initial encounter: Secondary | ICD-10-CM | POA: Diagnosis not present

## 2019-08-12 DIAGNOSIS — E1122 Type 2 diabetes mellitus with diabetic chronic kidney disease: Secondary | ICD-10-CM | POA: Diagnosis not present

## 2019-08-12 DIAGNOSIS — S0990XA Unspecified injury of head, initial encounter: Secondary | ICD-10-CM | POA: Diagnosis not present

## 2019-08-12 DIAGNOSIS — J9811 Atelectasis: Secondary | ICD-10-CM | POA: Diagnosis not present

## 2019-08-12 DIAGNOSIS — R531 Weakness: Secondary | ICD-10-CM | POA: Diagnosis not present

## 2019-08-12 DIAGNOSIS — G8191 Hemiplegia, unspecified affecting right dominant side: Secondary | ICD-10-CM | POA: Diagnosis not present

## 2019-08-12 DIAGNOSIS — Z87891 Personal history of nicotine dependence: Secondary | ICD-10-CM | POA: Diagnosis not present

## 2019-08-12 DIAGNOSIS — R5381 Other malaise: Secondary | ICD-10-CM | POA: Diagnosis not present

## 2019-08-12 DIAGNOSIS — R1312 Dysphagia, oropharyngeal phase: Secondary | ICD-10-CM | POA: Diagnosis not present

## 2019-08-12 DIAGNOSIS — Z7401 Bed confinement status: Secondary | ICD-10-CM | POA: Diagnosis not present

## 2019-08-12 DIAGNOSIS — I10 Essential (primary) hypertension: Secondary | ICD-10-CM | POA: Diagnosis not present

## 2019-08-12 DIAGNOSIS — S79912A Unspecified injury of left hip, initial encounter: Secondary | ICD-10-CM | POA: Diagnosis not present

## 2019-08-12 DIAGNOSIS — N390 Urinary tract infection, site not specified: Secondary | ICD-10-CM | POA: Diagnosis not present

## 2019-08-12 DIAGNOSIS — Z743 Need for continuous supervision: Secondary | ICD-10-CM | POA: Diagnosis not present

## 2019-08-12 DIAGNOSIS — K922 Gastrointestinal hemorrhage, unspecified: Secondary | ICD-10-CM | POA: Diagnosis not present

## 2019-08-12 DIAGNOSIS — D6869 Other thrombophilia: Secondary | ICD-10-CM | POA: Diagnosis not present

## 2019-08-12 DIAGNOSIS — S79911A Unspecified injury of right hip, initial encounter: Secondary | ICD-10-CM | POA: Diagnosis not present

## 2019-08-12 DIAGNOSIS — M6281 Muscle weakness (generalized): Secondary | ICD-10-CM | POA: Diagnosis not present

## 2019-08-12 DIAGNOSIS — Z66 Do not resuscitate: Secondary | ICD-10-CM | POA: Diagnosis not present

## 2019-08-12 DIAGNOSIS — R42 Dizziness and giddiness: Secondary | ICD-10-CM | POA: Diagnosis not present

## 2019-08-12 DIAGNOSIS — R918 Other nonspecific abnormal finding of lung field: Secondary | ICD-10-CM | POA: Diagnosis not present

## 2019-08-12 DIAGNOSIS — W19XXXA Unspecified fall, initial encounter: Secondary | ICD-10-CM | POA: Diagnosis not present

## 2019-08-12 DIAGNOSIS — J449 Chronic obstructive pulmonary disease, unspecified: Secondary | ICD-10-CM | POA: Diagnosis not present

## 2019-08-12 DIAGNOSIS — L89212 Pressure ulcer of right hip, stage 2: Secondary | ICD-10-CM | POA: Diagnosis not present

## 2019-08-12 DIAGNOSIS — E870 Hyperosmolality and hypernatremia: Secondary | ICD-10-CM | POA: Diagnosis not present

## 2019-09-04 DIAGNOSIS — R410 Disorientation, unspecified: Secondary | ICD-10-CM | POA: Diagnosis not present

## 2019-09-04 DIAGNOSIS — Z7401 Bed confinement status: Secondary | ICD-10-CM | POA: Diagnosis not present

## 2019-09-04 DIAGNOSIS — D649 Anemia, unspecified: Secondary | ICD-10-CM | POA: Diagnosis not present

## 2019-09-04 DIAGNOSIS — N184 Chronic kidney disease, stage 4 (severe): Secondary | ICD-10-CM | POA: Diagnosis not present

## 2019-09-04 DIAGNOSIS — I10 Essential (primary) hypertension: Secondary | ICD-10-CM | POA: Diagnosis not present

## 2019-09-04 DIAGNOSIS — J449 Chronic obstructive pulmonary disease, unspecified: Secondary | ICD-10-CM | POA: Diagnosis not present

## 2019-09-04 DIAGNOSIS — J441 Chronic obstructive pulmonary disease with (acute) exacerbation: Secondary | ICD-10-CM | POA: Diagnosis not present

## 2019-09-04 DIAGNOSIS — D6869 Other thrombophilia: Secondary | ICD-10-CM | POA: Diagnosis not present

## 2019-09-04 DIAGNOSIS — E1122 Type 2 diabetes mellitus with diabetic chronic kidney disease: Secondary | ICD-10-CM | POA: Diagnosis not present

## 2019-09-04 DIAGNOSIS — R5381 Other malaise: Secondary | ICD-10-CM | POA: Diagnosis not present

## 2019-09-04 DIAGNOSIS — R69 Illness, unspecified: Secondary | ICD-10-CM | POA: Diagnosis not present

## 2019-09-04 DIAGNOSIS — M1991 Primary osteoarthritis, unspecified site: Secondary | ICD-10-CM | POA: Diagnosis not present

## 2019-09-04 DIAGNOSIS — Z743 Need for continuous supervision: Secondary | ICD-10-CM | POA: Diagnosis not present

## 2019-09-04 DIAGNOSIS — R1312 Dysphagia, oropharyngeal phase: Secondary | ICD-10-CM | POA: Diagnosis not present

## 2019-09-04 DIAGNOSIS — Z87891 Personal history of nicotine dependence: Secondary | ICD-10-CM | POA: Diagnosis not present

## 2019-09-04 DIAGNOSIS — E119 Type 2 diabetes mellitus without complications: Secondary | ICD-10-CM | POA: Diagnosis not present

## 2019-09-04 DIAGNOSIS — M6281 Muscle weakness (generalized): Secondary | ICD-10-CM | POA: Diagnosis not present

## 2019-09-04 DIAGNOSIS — L89212 Pressure ulcer of right hip, stage 2: Secondary | ICD-10-CM | POA: Diagnosis not present

## 2019-09-04 DIAGNOSIS — E785 Hyperlipidemia, unspecified: Secondary | ICD-10-CM | POA: Diagnosis not present

## 2019-09-04 DIAGNOSIS — I2510 Atherosclerotic heart disease of native coronary artery without angina pectoris: Secondary | ICD-10-CM | POA: Diagnosis not present

## 2019-09-04 DIAGNOSIS — R54 Age-related physical debility: Secondary | ICD-10-CM | POA: Diagnosis not present

## 2019-09-04 DIAGNOSIS — G8191 Hemiplegia, unspecified affecting right dominant side: Secondary | ICD-10-CM | POA: Diagnosis not present

## 2019-09-04 DIAGNOSIS — K922 Gastrointestinal hemorrhage, unspecified: Secondary | ICD-10-CM | POA: Diagnosis not present

## 2019-09-04 DIAGNOSIS — I129 Hypertensive chronic kidney disease with stage 1 through stage 4 chronic kidney disease, or unspecified chronic kidney disease: Secondary | ICD-10-CM | POA: Diagnosis not present

## 2019-09-08 DIAGNOSIS — G8191 Hemiplegia, unspecified affecting right dominant side: Secondary | ICD-10-CM | POA: Diagnosis not present

## 2019-09-08 DIAGNOSIS — E119 Type 2 diabetes mellitus without complications: Secondary | ICD-10-CM | POA: Diagnosis not present

## 2019-09-08 DIAGNOSIS — K922 Gastrointestinal hemorrhage, unspecified: Secondary | ICD-10-CM | POA: Diagnosis not present

## 2019-09-08 DIAGNOSIS — I10 Essential (primary) hypertension: Secondary | ICD-10-CM | POA: Diagnosis not present

## 2019-09-12 DIAGNOSIS — E119 Type 2 diabetes mellitus without complications: Secondary | ICD-10-CM | POA: Diagnosis not present

## 2019-09-12 DIAGNOSIS — I10 Essential (primary) hypertension: Secondary | ICD-10-CM | POA: Diagnosis not present

## 2019-09-12 DIAGNOSIS — K922 Gastrointestinal hemorrhage, unspecified: Secondary | ICD-10-CM | POA: Diagnosis not present

## 2019-09-12 DIAGNOSIS — G8191 Hemiplegia, unspecified affecting right dominant side: Secondary | ICD-10-CM | POA: Diagnosis not present

## 2019-09-15 DIAGNOSIS — E1122 Type 2 diabetes mellitus with diabetic chronic kidney disease: Secondary | ICD-10-CM | POA: Diagnosis not present

## 2019-09-15 DIAGNOSIS — I129 Hypertensive chronic kidney disease with stage 1 through stage 4 chronic kidney disease, or unspecified chronic kidney disease: Secondary | ICD-10-CM | POA: Diagnosis not present

## 2019-09-15 DIAGNOSIS — N184 Chronic kidney disease, stage 4 (severe): Secondary | ICD-10-CM | POA: Diagnosis not present

## 2019-09-15 DIAGNOSIS — J441 Chronic obstructive pulmonary disease with (acute) exacerbation: Secondary | ICD-10-CM | POA: Diagnosis not present

## 2019-09-15 DIAGNOSIS — Z87891 Personal history of nicotine dependence: Secondary | ICD-10-CM | POA: Diagnosis not present

## 2019-09-17 DIAGNOSIS — I2510 Atherosclerotic heart disease of native coronary artery without angina pectoris: Secondary | ICD-10-CM | POA: Diagnosis not present

## 2019-09-17 DIAGNOSIS — E119 Type 2 diabetes mellitus without complications: Secondary | ICD-10-CM | POA: Diagnosis not present

## 2019-09-17 DIAGNOSIS — K922 Gastrointestinal hemorrhage, unspecified: Secondary | ICD-10-CM | POA: Diagnosis not present

## 2019-09-24 DIAGNOSIS — E119 Type 2 diabetes mellitus without complications: Secondary | ICD-10-CM | POA: Diagnosis not present

## 2019-09-24 DIAGNOSIS — I10 Essential (primary) hypertension: Secondary | ICD-10-CM | POA: Diagnosis not present

## 2019-09-24 DIAGNOSIS — I2510 Atherosclerotic heart disease of native coronary artery without angina pectoris: Secondary | ICD-10-CM | POA: Diagnosis not present

## 2019-09-24 DIAGNOSIS — K922 Gastrointestinal hemorrhage, unspecified: Secondary | ICD-10-CM | POA: Diagnosis not present

## 2019-09-27 DIAGNOSIS — I10 Essential (primary) hypertension: Secondary | ICD-10-CM | POA: Diagnosis not present

## 2019-09-27 DIAGNOSIS — E785 Hyperlipidemia, unspecified: Secondary | ICD-10-CM | POA: Diagnosis not present

## 2019-09-27 DIAGNOSIS — D519 Vitamin B12 deficiency anemia, unspecified: Secondary | ICD-10-CM | POA: Diagnosis not present

## 2019-09-27 DIAGNOSIS — E119 Type 2 diabetes mellitus without complications: Secondary | ICD-10-CM | POA: Diagnosis not present

## 2019-09-27 DIAGNOSIS — D649 Anemia, unspecified: Secondary | ICD-10-CM | POA: Diagnosis not present

## 2019-10-06 DIAGNOSIS — R2681 Unsteadiness on feet: Secondary | ICD-10-CM | POA: Diagnosis not present

## 2019-10-07 DIAGNOSIS — R2681 Unsteadiness on feet: Secondary | ICD-10-CM | POA: Diagnosis not present

## 2019-10-08 DIAGNOSIS — R2681 Unsteadiness on feet: Secondary | ICD-10-CM | POA: Diagnosis not present

## 2019-10-09 DIAGNOSIS — R7989 Other specified abnormal findings of blood chemistry: Secondary | ICD-10-CM | POA: Diagnosis not present

## 2019-10-09 DIAGNOSIS — D649 Anemia, unspecified: Secondary | ICD-10-CM | POA: Diagnosis not present

## 2019-10-09 DIAGNOSIS — N390 Urinary tract infection, site not specified: Secondary | ICD-10-CM | POA: Diagnosis not present

## 2019-10-09 DIAGNOSIS — R319 Hematuria, unspecified: Secondary | ICD-10-CM | POA: Diagnosis not present

## 2019-10-13 DIAGNOSIS — I10 Essential (primary) hypertension: Secondary | ICD-10-CM | POA: Diagnosis not present

## 2019-10-13 DIAGNOSIS — R2681 Unsteadiness on feet: Secondary | ICD-10-CM | POA: Diagnosis not present

## 2019-10-13 DIAGNOSIS — K922 Gastrointestinal hemorrhage, unspecified: Secondary | ICD-10-CM | POA: Diagnosis not present

## 2019-10-15 DIAGNOSIS — E1122 Type 2 diabetes mellitus with diabetic chronic kidney disease: Secondary | ICD-10-CM | POA: Diagnosis not present

## 2019-10-15 DIAGNOSIS — Z87891 Personal history of nicotine dependence: Secondary | ICD-10-CM | POA: Diagnosis not present

## 2019-10-15 DIAGNOSIS — I129 Hypertensive chronic kidney disease with stage 1 through stage 4 chronic kidney disease, or unspecified chronic kidney disease: Secondary | ICD-10-CM | POA: Diagnosis not present

## 2019-10-15 DIAGNOSIS — N184 Chronic kidney disease, stage 4 (severe): Secondary | ICD-10-CM | POA: Diagnosis not present

## 2019-10-15 DIAGNOSIS — J441 Chronic obstructive pulmonary disease with (acute) exacerbation: Secondary | ICD-10-CM | POA: Diagnosis not present

## 2019-11-10 DIAGNOSIS — K922 Gastrointestinal hemorrhage, unspecified: Secondary | ICD-10-CM | POA: Diagnosis not present

## 2019-11-10 DIAGNOSIS — R2681 Unsteadiness on feet: Secondary | ICD-10-CM | POA: Diagnosis not present

## 2019-11-10 DIAGNOSIS — I10 Essential (primary) hypertension: Secondary | ICD-10-CM | POA: Diagnosis not present

## 2019-11-10 DIAGNOSIS — E119 Type 2 diabetes mellitus without complications: Secondary | ICD-10-CM | POA: Diagnosis not present

## 2019-12-08 DIAGNOSIS — I10 Essential (primary) hypertension: Secondary | ICD-10-CM | POA: Diagnosis not present

## 2019-12-08 DIAGNOSIS — G8191 Hemiplegia, unspecified affecting right dominant side: Secondary | ICD-10-CM | POA: Diagnosis not present

## 2019-12-08 DIAGNOSIS — E119 Type 2 diabetes mellitus without complications: Secondary | ICD-10-CM | POA: Diagnosis not present

## 2019-12-08 DIAGNOSIS — I2510 Atherosclerotic heart disease of native coronary artery without angina pectoris: Secondary | ICD-10-CM | POA: Diagnosis not present

## 2019-12-08 DIAGNOSIS — K922 Gastrointestinal hemorrhage, unspecified: Secondary | ICD-10-CM | POA: Diagnosis not present

## 2019-12-16 DIAGNOSIS — N184 Chronic kidney disease, stage 4 (severe): Secondary | ICD-10-CM | POA: Diagnosis not present

## 2019-12-16 DIAGNOSIS — I129 Hypertensive chronic kidney disease with stage 1 through stage 4 chronic kidney disease, or unspecified chronic kidney disease: Secondary | ICD-10-CM | POA: Diagnosis not present

## 2019-12-16 DIAGNOSIS — J441 Chronic obstructive pulmonary disease with (acute) exacerbation: Secondary | ICD-10-CM | POA: Diagnosis not present

## 2019-12-16 DIAGNOSIS — E1122 Type 2 diabetes mellitus with diabetic chronic kidney disease: Secondary | ICD-10-CM | POA: Diagnosis not present

## 2019-12-16 DIAGNOSIS — Z87891 Personal history of nicotine dependence: Secondary | ICD-10-CM | POA: Diagnosis not present

## 2020-01-05 DIAGNOSIS — I10 Essential (primary) hypertension: Secondary | ICD-10-CM | POA: Diagnosis not present

## 2020-01-05 DIAGNOSIS — E119 Type 2 diabetes mellitus without complications: Secondary | ICD-10-CM | POA: Diagnosis not present

## 2020-01-26 DIAGNOSIS — L603 Nail dystrophy: Secondary | ICD-10-CM | POA: Diagnosis not present

## 2020-01-26 DIAGNOSIS — E1159 Type 2 diabetes mellitus with other circulatory complications: Secondary | ICD-10-CM | POA: Diagnosis not present

## 2020-01-27 DIAGNOSIS — D3131 Benign neoplasm of right choroid: Secondary | ICD-10-CM | POA: Diagnosis not present

## 2020-01-27 DIAGNOSIS — E119 Type 2 diabetes mellitus without complications: Secondary | ICD-10-CM | POA: Diagnosis not present

## 2020-01-27 DIAGNOSIS — H524 Presbyopia: Secondary | ICD-10-CM | POA: Diagnosis not present

## 2020-02-02 DIAGNOSIS — K922 Gastrointestinal hemorrhage, unspecified: Secondary | ICD-10-CM | POA: Diagnosis not present

## 2020-02-02 DIAGNOSIS — I10 Essential (primary) hypertension: Secondary | ICD-10-CM | POA: Diagnosis not present

## 2020-02-02 DIAGNOSIS — E119 Type 2 diabetes mellitus without complications: Secondary | ICD-10-CM | POA: Diagnosis not present

## 2020-02-02 DIAGNOSIS — J449 Chronic obstructive pulmonary disease, unspecified: Secondary | ICD-10-CM | POA: Diagnosis not present

## 2020-02-08 DIAGNOSIS — R1312 Dysphagia, oropharyngeal phase: Secondary | ICD-10-CM | POA: Diagnosis not present

## 2020-02-08 DIAGNOSIS — Z043 Encounter for examination and observation following other accident: Secondary | ICD-10-CM | POA: Diagnosis not present

## 2020-02-08 DIAGNOSIS — G8191 Hemiplegia, unspecified affecting right dominant side: Secondary | ICD-10-CM | POA: Diagnosis not present

## 2020-02-08 DIAGNOSIS — M6281 Muscle weakness (generalized): Secondary | ICD-10-CM | POA: Diagnosis not present

## 2020-02-08 DIAGNOSIS — W010XXA Fall on same level from slipping, tripping and stumbling without subsequent striking against object, initial encounter: Secondary | ICD-10-CM | POA: Diagnosis not present

## 2020-02-14 DIAGNOSIS — I129 Hypertensive chronic kidney disease with stage 1 through stage 4 chronic kidney disease, or unspecified chronic kidney disease: Secondary | ICD-10-CM | POA: Diagnosis not present

## 2020-02-14 DIAGNOSIS — J441 Chronic obstructive pulmonary disease with (acute) exacerbation: Secondary | ICD-10-CM | POA: Diagnosis not present

## 2020-02-14 DIAGNOSIS — E1122 Type 2 diabetes mellitus with diabetic chronic kidney disease: Secondary | ICD-10-CM | POA: Diagnosis not present

## 2020-02-14 DIAGNOSIS — N184 Chronic kidney disease, stage 4 (severe): Secondary | ICD-10-CM | POA: Diagnosis not present

## 2020-02-14 DIAGNOSIS — Z87891 Personal history of nicotine dependence: Secondary | ICD-10-CM | POA: Diagnosis not present

## 2020-04-06 DIAGNOSIS — D500 Iron deficiency anemia secondary to blood loss (chronic): Secondary | ICD-10-CM | POA: Diagnosis not present

## 2020-04-06 DIAGNOSIS — E1122 Type 2 diabetes mellitus with diabetic chronic kidney disease: Secondary | ICD-10-CM | POA: Diagnosis not present

## 2020-04-06 DIAGNOSIS — R350 Frequency of micturition: Secondary | ICD-10-CM | POA: Diagnosis not present

## 2020-04-06 DIAGNOSIS — K922 Gastrointestinal hemorrhage, unspecified: Secondary | ICD-10-CM | POA: Diagnosis not present

## 2020-04-06 DIAGNOSIS — I809 Phlebitis and thrombophlebitis of unspecified site: Secondary | ICD-10-CM | POA: Diagnosis not present

## 2020-04-06 DIAGNOSIS — R54 Age-related physical debility: Secondary | ICD-10-CM | POA: Diagnosis not present

## 2020-04-19 DIAGNOSIS — R222 Localized swelling, mass and lump, trunk: Secondary | ICD-10-CM | POA: Diagnosis not present

## 2020-04-19 DIAGNOSIS — R2232 Localized swelling, mass and lump, left upper limb: Secondary | ICD-10-CM | POA: Diagnosis not present

## 2020-04-20 DIAGNOSIS — K922 Gastrointestinal hemorrhage, unspecified: Secondary | ICD-10-CM | POA: Diagnosis not present

## 2020-04-20 DIAGNOSIS — R2681 Unsteadiness on feet: Secondary | ICD-10-CM | POA: Diagnosis not present

## 2020-04-20 DIAGNOSIS — E875 Hyperkalemia: Secondary | ICD-10-CM | POA: Diagnosis not present

## 2020-04-20 DIAGNOSIS — I809 Phlebitis and thrombophlebitis of unspecified site: Secondary | ICD-10-CM | POA: Diagnosis not present

## 2020-04-20 DIAGNOSIS — S60021A Contusion of right index finger without damage to nail, initial encounter: Secondary | ICD-10-CM | POA: Diagnosis not present

## 2020-04-20 DIAGNOSIS — I10 Essential (primary) hypertension: Secondary | ICD-10-CM | POA: Diagnosis not present

## 2020-04-20 DIAGNOSIS — D500 Iron deficiency anemia secondary to blood loss (chronic): Secondary | ICD-10-CM | POA: Diagnosis not present

## 2020-04-20 DIAGNOSIS — E1122 Type 2 diabetes mellitus with diabetic chronic kidney disease: Secondary | ICD-10-CM | POA: Diagnosis not present

## 2020-04-26 DIAGNOSIS — N183 Chronic kidney disease, stage 3 unspecified: Secondary | ICD-10-CM | POA: Diagnosis not present

## 2020-04-26 DIAGNOSIS — Z9181 History of falling: Secondary | ICD-10-CM | POA: Diagnosis not present

## 2020-04-26 DIAGNOSIS — M19042 Primary osteoarthritis, left hand: Secondary | ICD-10-CM | POA: Diagnosis not present

## 2020-04-26 DIAGNOSIS — M722 Plantar fascial fibromatosis: Secondary | ICD-10-CM | POA: Diagnosis not present

## 2020-04-26 DIAGNOSIS — I129 Hypertensive chronic kidney disease with stage 1 through stage 4 chronic kidney disease, or unspecified chronic kidney disease: Secondary | ICD-10-CM | POA: Diagnosis not present

## 2020-04-26 DIAGNOSIS — E782 Mixed hyperlipidemia: Secondary | ICD-10-CM | POA: Diagnosis not present

## 2020-04-26 DIAGNOSIS — M19041 Primary osteoarthritis, right hand: Secondary | ICD-10-CM | POA: Diagnosis not present

## 2020-04-26 DIAGNOSIS — Z951 Presence of aortocoronary bypass graft: Secondary | ICD-10-CM | POA: Diagnosis not present

## 2020-04-26 DIAGNOSIS — J449 Chronic obstructive pulmonary disease, unspecified: Secondary | ICD-10-CM | POA: Diagnosis not present

## 2020-04-26 DIAGNOSIS — K259 Gastric ulcer, unspecified as acute or chronic, without hemorrhage or perforation: Secondary | ICD-10-CM | POA: Diagnosis not present

## 2020-04-26 DIAGNOSIS — R531 Weakness: Secondary | ICD-10-CM | POA: Diagnosis not present

## 2020-04-26 DIAGNOSIS — I2510 Atherosclerotic heart disease of native coronary artery without angina pectoris: Secondary | ICD-10-CM | POA: Diagnosis not present

## 2020-04-26 DIAGNOSIS — Z8616 Personal history of COVID-19: Secondary | ICD-10-CM | POA: Diagnosis not present

## 2020-04-26 DIAGNOSIS — Z7984 Long term (current) use of oral hypoglycemic drugs: Secondary | ICD-10-CM | POA: Diagnosis not present

## 2020-04-26 DIAGNOSIS — D500 Iron deficiency anemia secondary to blood loss (chronic): Secondary | ICD-10-CM | POA: Diagnosis not present

## 2020-04-26 DIAGNOSIS — L02411 Cutaneous abscess of right axilla: Secondary | ICD-10-CM | POA: Diagnosis not present

## 2020-04-26 DIAGNOSIS — E875 Hyperkalemia: Secondary | ICD-10-CM | POA: Diagnosis not present

## 2020-04-26 DIAGNOSIS — E1122 Type 2 diabetes mellitus with diabetic chronic kidney disease: Secondary | ICD-10-CM | POA: Diagnosis not present

## 2020-05-04 DIAGNOSIS — I129 Hypertensive chronic kidney disease with stage 1 through stage 4 chronic kidney disease, or unspecified chronic kidney disease: Secondary | ICD-10-CM | POA: Diagnosis not present

## 2020-05-04 DIAGNOSIS — Z8616 Personal history of COVID-19: Secondary | ICD-10-CM | POA: Diagnosis not present

## 2020-05-04 DIAGNOSIS — L02411 Cutaneous abscess of right axilla: Secondary | ICD-10-CM | POA: Diagnosis not present

## 2020-05-04 DIAGNOSIS — M722 Plantar fascial fibromatosis: Secondary | ICD-10-CM | POA: Diagnosis not present

## 2020-05-04 DIAGNOSIS — I2510 Atherosclerotic heart disease of native coronary artery without angina pectoris: Secondary | ICD-10-CM | POA: Diagnosis not present

## 2020-05-04 DIAGNOSIS — Z951 Presence of aortocoronary bypass graft: Secondary | ICD-10-CM | POA: Diagnosis not present

## 2020-05-04 DIAGNOSIS — E1122 Type 2 diabetes mellitus with diabetic chronic kidney disease: Secondary | ICD-10-CM | POA: Diagnosis not present

## 2020-05-04 DIAGNOSIS — Z7984 Long term (current) use of oral hypoglycemic drugs: Secondary | ICD-10-CM | POA: Diagnosis not present

## 2020-05-04 DIAGNOSIS — M19042 Primary osteoarthritis, left hand: Secondary | ICD-10-CM | POA: Diagnosis not present

## 2020-05-04 DIAGNOSIS — E782 Mixed hyperlipidemia: Secondary | ICD-10-CM | POA: Diagnosis not present

## 2020-05-04 DIAGNOSIS — R531 Weakness: Secondary | ICD-10-CM | POA: Diagnosis not present

## 2020-05-04 DIAGNOSIS — E875 Hyperkalemia: Secondary | ICD-10-CM | POA: Diagnosis not present

## 2020-05-04 DIAGNOSIS — J449 Chronic obstructive pulmonary disease, unspecified: Secondary | ICD-10-CM | POA: Diagnosis not present

## 2020-05-04 DIAGNOSIS — D500 Iron deficiency anemia secondary to blood loss (chronic): Secondary | ICD-10-CM | POA: Diagnosis not present

## 2020-05-04 DIAGNOSIS — N183 Chronic kidney disease, stage 3 unspecified: Secondary | ICD-10-CM | POA: Diagnosis not present

## 2020-05-04 DIAGNOSIS — K259 Gastric ulcer, unspecified as acute or chronic, without hemorrhage or perforation: Secondary | ICD-10-CM | POA: Diagnosis not present

## 2020-05-04 DIAGNOSIS — M19041 Primary osteoarthritis, right hand: Secondary | ICD-10-CM | POA: Diagnosis not present

## 2020-05-04 DIAGNOSIS — Z9181 History of falling: Secondary | ICD-10-CM | POA: Diagnosis not present

## 2020-05-06 DIAGNOSIS — E1122 Type 2 diabetes mellitus with diabetic chronic kidney disease: Secondary | ICD-10-CM | POA: Diagnosis not present

## 2020-05-06 DIAGNOSIS — I129 Hypertensive chronic kidney disease with stage 1 through stage 4 chronic kidney disease, or unspecified chronic kidney disease: Secondary | ICD-10-CM | POA: Diagnosis not present

## 2020-05-06 DIAGNOSIS — Z951 Presence of aortocoronary bypass graft: Secondary | ICD-10-CM | POA: Diagnosis not present

## 2020-05-06 DIAGNOSIS — Z9181 History of falling: Secondary | ICD-10-CM | POA: Diagnosis not present

## 2020-05-06 DIAGNOSIS — Z7984 Long term (current) use of oral hypoglycemic drugs: Secondary | ICD-10-CM | POA: Diagnosis not present

## 2020-05-06 DIAGNOSIS — N183 Chronic kidney disease, stage 3 unspecified: Secondary | ICD-10-CM | POA: Diagnosis not present

## 2020-05-06 DIAGNOSIS — I2510 Atherosclerotic heart disease of native coronary artery without angina pectoris: Secondary | ICD-10-CM | POA: Diagnosis not present

## 2020-05-06 DIAGNOSIS — K259 Gastric ulcer, unspecified as acute or chronic, without hemorrhage or perforation: Secondary | ICD-10-CM | POA: Diagnosis not present

## 2020-05-06 DIAGNOSIS — Z8616 Personal history of COVID-19: Secondary | ICD-10-CM | POA: Diagnosis not present

## 2020-05-06 DIAGNOSIS — M19041 Primary osteoarthritis, right hand: Secondary | ICD-10-CM | POA: Diagnosis not present

## 2020-05-06 DIAGNOSIS — E875 Hyperkalemia: Secondary | ICD-10-CM | POA: Diagnosis not present

## 2020-05-06 DIAGNOSIS — J449 Chronic obstructive pulmonary disease, unspecified: Secondary | ICD-10-CM | POA: Diagnosis not present

## 2020-05-06 DIAGNOSIS — R531 Weakness: Secondary | ICD-10-CM | POA: Diagnosis not present

## 2020-05-06 DIAGNOSIS — M722 Plantar fascial fibromatosis: Secondary | ICD-10-CM | POA: Diagnosis not present

## 2020-05-06 DIAGNOSIS — D500 Iron deficiency anemia secondary to blood loss (chronic): Secondary | ICD-10-CM | POA: Diagnosis not present

## 2020-05-06 DIAGNOSIS — M19042 Primary osteoarthritis, left hand: Secondary | ICD-10-CM | POA: Diagnosis not present

## 2020-05-06 DIAGNOSIS — E782 Mixed hyperlipidemia: Secondary | ICD-10-CM | POA: Diagnosis not present

## 2020-05-06 DIAGNOSIS — L02411 Cutaneous abscess of right axilla: Secondary | ICD-10-CM | POA: Diagnosis not present

## 2020-05-07 DIAGNOSIS — N183 Chronic kidney disease, stage 3 unspecified: Secondary | ICD-10-CM | POA: Diagnosis not present

## 2020-05-07 DIAGNOSIS — M722 Plantar fascial fibromatosis: Secondary | ICD-10-CM | POA: Diagnosis not present

## 2020-05-07 DIAGNOSIS — K259 Gastric ulcer, unspecified as acute or chronic, without hemorrhage or perforation: Secondary | ICD-10-CM | POA: Diagnosis not present

## 2020-05-07 DIAGNOSIS — Z8616 Personal history of COVID-19: Secondary | ICD-10-CM | POA: Diagnosis not present

## 2020-05-07 DIAGNOSIS — I129 Hypertensive chronic kidney disease with stage 1 through stage 4 chronic kidney disease, or unspecified chronic kidney disease: Secondary | ICD-10-CM | POA: Diagnosis not present

## 2020-05-07 DIAGNOSIS — D500 Iron deficiency anemia secondary to blood loss (chronic): Secondary | ICD-10-CM | POA: Diagnosis not present

## 2020-05-07 DIAGNOSIS — E875 Hyperkalemia: Secondary | ICD-10-CM | POA: Diagnosis not present

## 2020-05-07 DIAGNOSIS — M19042 Primary osteoarthritis, left hand: Secondary | ICD-10-CM | POA: Diagnosis not present

## 2020-05-07 DIAGNOSIS — I2510 Atherosclerotic heart disease of native coronary artery without angina pectoris: Secondary | ICD-10-CM | POA: Diagnosis not present

## 2020-05-07 DIAGNOSIS — Z9181 History of falling: Secondary | ICD-10-CM | POA: Diagnosis not present

## 2020-05-07 DIAGNOSIS — R531 Weakness: Secondary | ICD-10-CM | POA: Diagnosis not present

## 2020-05-07 DIAGNOSIS — E1122 Type 2 diabetes mellitus with diabetic chronic kidney disease: Secondary | ICD-10-CM | POA: Diagnosis not present

## 2020-05-07 DIAGNOSIS — E782 Mixed hyperlipidemia: Secondary | ICD-10-CM | POA: Diagnosis not present

## 2020-05-07 DIAGNOSIS — J449 Chronic obstructive pulmonary disease, unspecified: Secondary | ICD-10-CM | POA: Diagnosis not present

## 2020-05-07 DIAGNOSIS — Z7984 Long term (current) use of oral hypoglycemic drugs: Secondary | ICD-10-CM | POA: Diagnosis not present

## 2020-05-07 DIAGNOSIS — M19041 Primary osteoarthritis, right hand: Secondary | ICD-10-CM | POA: Diagnosis not present

## 2020-05-07 DIAGNOSIS — L02411 Cutaneous abscess of right axilla: Secondary | ICD-10-CM | POA: Diagnosis not present

## 2020-05-07 DIAGNOSIS — Z951 Presence of aortocoronary bypass graft: Secondary | ICD-10-CM | POA: Diagnosis not present

## 2020-05-11 DIAGNOSIS — Z9181 History of falling: Secondary | ICD-10-CM | POA: Diagnosis not present

## 2020-05-11 DIAGNOSIS — D500 Iron deficiency anemia secondary to blood loss (chronic): Secondary | ICD-10-CM | POA: Diagnosis not present

## 2020-05-11 DIAGNOSIS — Z951 Presence of aortocoronary bypass graft: Secondary | ICD-10-CM | POA: Diagnosis not present

## 2020-05-11 DIAGNOSIS — E875 Hyperkalemia: Secondary | ICD-10-CM | POA: Diagnosis not present

## 2020-05-11 DIAGNOSIS — Z8616 Personal history of COVID-19: Secondary | ICD-10-CM | POA: Diagnosis not present

## 2020-05-11 DIAGNOSIS — L02411 Cutaneous abscess of right axilla: Secondary | ICD-10-CM | POA: Diagnosis not present

## 2020-05-11 DIAGNOSIS — M722 Plantar fascial fibromatosis: Secondary | ICD-10-CM | POA: Diagnosis not present

## 2020-05-11 DIAGNOSIS — M19042 Primary osteoarthritis, left hand: Secondary | ICD-10-CM | POA: Diagnosis not present

## 2020-05-11 DIAGNOSIS — E782 Mixed hyperlipidemia: Secondary | ICD-10-CM | POA: Diagnosis not present

## 2020-05-11 DIAGNOSIS — J449 Chronic obstructive pulmonary disease, unspecified: Secondary | ICD-10-CM | POA: Diagnosis not present

## 2020-05-11 DIAGNOSIS — M19041 Primary osteoarthritis, right hand: Secondary | ICD-10-CM | POA: Diagnosis not present

## 2020-05-11 DIAGNOSIS — I129 Hypertensive chronic kidney disease with stage 1 through stage 4 chronic kidney disease, or unspecified chronic kidney disease: Secondary | ICD-10-CM | POA: Diagnosis not present

## 2020-05-11 DIAGNOSIS — I2510 Atherosclerotic heart disease of native coronary artery without angina pectoris: Secondary | ICD-10-CM | POA: Diagnosis not present

## 2020-05-11 DIAGNOSIS — Z7984 Long term (current) use of oral hypoglycemic drugs: Secondary | ICD-10-CM | POA: Diagnosis not present

## 2020-05-11 DIAGNOSIS — K259 Gastric ulcer, unspecified as acute or chronic, without hemorrhage or perforation: Secondary | ICD-10-CM | POA: Diagnosis not present

## 2020-05-11 DIAGNOSIS — E1122 Type 2 diabetes mellitus with diabetic chronic kidney disease: Secondary | ICD-10-CM | POA: Diagnosis not present

## 2020-05-11 DIAGNOSIS — N183 Chronic kidney disease, stage 3 unspecified: Secondary | ICD-10-CM | POA: Diagnosis not present

## 2020-05-11 DIAGNOSIS — R531 Weakness: Secondary | ICD-10-CM | POA: Diagnosis not present

## 2020-05-13 DIAGNOSIS — E875 Hyperkalemia: Secondary | ICD-10-CM | POA: Diagnosis not present

## 2020-05-13 DIAGNOSIS — I129 Hypertensive chronic kidney disease with stage 1 through stage 4 chronic kidney disease, or unspecified chronic kidney disease: Secondary | ICD-10-CM | POA: Diagnosis not present

## 2020-05-13 DIAGNOSIS — D500 Iron deficiency anemia secondary to blood loss (chronic): Secondary | ICD-10-CM | POA: Diagnosis not present

## 2020-05-13 DIAGNOSIS — N183 Chronic kidney disease, stage 3 unspecified: Secondary | ICD-10-CM | POA: Diagnosis not present

## 2020-05-13 DIAGNOSIS — Z7984 Long term (current) use of oral hypoglycemic drugs: Secondary | ICD-10-CM | POA: Diagnosis not present

## 2020-05-13 DIAGNOSIS — I2510 Atherosclerotic heart disease of native coronary artery without angina pectoris: Secondary | ICD-10-CM | POA: Diagnosis not present

## 2020-05-13 DIAGNOSIS — K259 Gastric ulcer, unspecified as acute or chronic, without hemorrhage or perforation: Secondary | ICD-10-CM | POA: Diagnosis not present

## 2020-05-13 DIAGNOSIS — R531 Weakness: Secondary | ICD-10-CM | POA: Diagnosis not present

## 2020-05-13 DIAGNOSIS — Z8616 Personal history of COVID-19: Secondary | ICD-10-CM | POA: Diagnosis not present

## 2020-05-13 DIAGNOSIS — J449 Chronic obstructive pulmonary disease, unspecified: Secondary | ICD-10-CM | POA: Diagnosis not present

## 2020-05-13 DIAGNOSIS — E1122 Type 2 diabetes mellitus with diabetic chronic kidney disease: Secondary | ICD-10-CM | POA: Diagnosis not present

## 2020-05-13 DIAGNOSIS — M19042 Primary osteoarthritis, left hand: Secondary | ICD-10-CM | POA: Diagnosis not present

## 2020-05-13 DIAGNOSIS — L02411 Cutaneous abscess of right axilla: Secondary | ICD-10-CM | POA: Diagnosis not present

## 2020-05-13 DIAGNOSIS — E782 Mixed hyperlipidemia: Secondary | ICD-10-CM | POA: Diagnosis not present

## 2020-05-13 DIAGNOSIS — M19041 Primary osteoarthritis, right hand: Secondary | ICD-10-CM | POA: Diagnosis not present

## 2020-05-13 DIAGNOSIS — Z9181 History of falling: Secondary | ICD-10-CM | POA: Diagnosis not present

## 2020-05-13 DIAGNOSIS — M722 Plantar fascial fibromatosis: Secondary | ICD-10-CM | POA: Diagnosis not present

## 2020-05-13 DIAGNOSIS — Z951 Presence of aortocoronary bypass graft: Secondary | ICD-10-CM | POA: Diagnosis not present

## 2020-05-15 DIAGNOSIS — E1122 Type 2 diabetes mellitus with diabetic chronic kidney disease: Secondary | ICD-10-CM | POA: Diagnosis not present

## 2020-05-15 DIAGNOSIS — I129 Hypertensive chronic kidney disease with stage 1 through stage 4 chronic kidney disease, or unspecified chronic kidney disease: Secondary | ICD-10-CM | POA: Diagnosis not present

## 2020-05-15 DIAGNOSIS — J441 Chronic obstructive pulmonary disease with (acute) exacerbation: Secondary | ICD-10-CM | POA: Diagnosis not present

## 2020-05-15 DIAGNOSIS — Z87891 Personal history of nicotine dependence: Secondary | ICD-10-CM | POA: Diagnosis not present

## 2020-05-15 DIAGNOSIS — E7849 Other hyperlipidemia: Secondary | ICD-10-CM | POA: Diagnosis not present

## 2020-05-15 DIAGNOSIS — N184 Chronic kidney disease, stage 4 (severe): Secondary | ICD-10-CM | POA: Diagnosis not present

## 2020-05-18 DIAGNOSIS — L02411 Cutaneous abscess of right axilla: Secondary | ICD-10-CM | POA: Diagnosis not present

## 2020-05-18 DIAGNOSIS — Z9181 History of falling: Secondary | ICD-10-CM | POA: Diagnosis not present

## 2020-05-18 DIAGNOSIS — N183 Chronic kidney disease, stage 3 unspecified: Secondary | ICD-10-CM | POA: Diagnosis not present

## 2020-05-18 DIAGNOSIS — M722 Plantar fascial fibromatosis: Secondary | ICD-10-CM | POA: Diagnosis not present

## 2020-05-18 DIAGNOSIS — I129 Hypertensive chronic kidney disease with stage 1 through stage 4 chronic kidney disease, or unspecified chronic kidney disease: Secondary | ICD-10-CM | POA: Diagnosis not present

## 2020-05-18 DIAGNOSIS — K259 Gastric ulcer, unspecified as acute or chronic, without hemorrhage or perforation: Secondary | ICD-10-CM | POA: Diagnosis not present

## 2020-05-18 DIAGNOSIS — I2510 Atherosclerotic heart disease of native coronary artery without angina pectoris: Secondary | ICD-10-CM | POA: Diagnosis not present

## 2020-05-18 DIAGNOSIS — E1122 Type 2 diabetes mellitus with diabetic chronic kidney disease: Secondary | ICD-10-CM | POA: Diagnosis not present

## 2020-05-18 DIAGNOSIS — R531 Weakness: Secondary | ICD-10-CM | POA: Diagnosis not present

## 2020-05-18 DIAGNOSIS — Z7984 Long term (current) use of oral hypoglycemic drugs: Secondary | ICD-10-CM | POA: Diagnosis not present

## 2020-05-18 DIAGNOSIS — D500 Iron deficiency anemia secondary to blood loss (chronic): Secondary | ICD-10-CM | POA: Diagnosis not present

## 2020-05-18 DIAGNOSIS — M19042 Primary osteoarthritis, left hand: Secondary | ICD-10-CM | POA: Diagnosis not present

## 2020-05-18 DIAGNOSIS — E875 Hyperkalemia: Secondary | ICD-10-CM | POA: Diagnosis not present

## 2020-05-18 DIAGNOSIS — J449 Chronic obstructive pulmonary disease, unspecified: Secondary | ICD-10-CM | POA: Diagnosis not present

## 2020-05-18 DIAGNOSIS — E782 Mixed hyperlipidemia: Secondary | ICD-10-CM | POA: Diagnosis not present

## 2020-05-18 DIAGNOSIS — M19041 Primary osteoarthritis, right hand: Secondary | ICD-10-CM | POA: Diagnosis not present

## 2020-05-18 DIAGNOSIS — Z951 Presence of aortocoronary bypass graft: Secondary | ICD-10-CM | POA: Diagnosis not present

## 2020-05-18 DIAGNOSIS — Z8616 Personal history of COVID-19: Secondary | ICD-10-CM | POA: Diagnosis not present

## 2020-05-20 DIAGNOSIS — I129 Hypertensive chronic kidney disease with stage 1 through stage 4 chronic kidney disease, or unspecified chronic kidney disease: Secondary | ICD-10-CM | POA: Diagnosis not present

## 2020-05-20 DIAGNOSIS — Z8616 Personal history of COVID-19: Secondary | ICD-10-CM | POA: Diagnosis not present

## 2020-05-20 DIAGNOSIS — R531 Weakness: Secondary | ICD-10-CM | POA: Diagnosis not present

## 2020-05-20 DIAGNOSIS — L02411 Cutaneous abscess of right axilla: Secondary | ICD-10-CM | POA: Diagnosis not present

## 2020-05-20 DIAGNOSIS — E782 Mixed hyperlipidemia: Secondary | ICD-10-CM | POA: Diagnosis not present

## 2020-05-20 DIAGNOSIS — M19042 Primary osteoarthritis, left hand: Secondary | ICD-10-CM | POA: Diagnosis not present

## 2020-05-20 DIAGNOSIS — N183 Chronic kidney disease, stage 3 unspecified: Secondary | ICD-10-CM | POA: Diagnosis not present

## 2020-05-20 DIAGNOSIS — M722 Plantar fascial fibromatosis: Secondary | ICD-10-CM | POA: Diagnosis not present

## 2020-05-20 DIAGNOSIS — Z7984 Long term (current) use of oral hypoglycemic drugs: Secondary | ICD-10-CM | POA: Diagnosis not present

## 2020-05-20 DIAGNOSIS — J449 Chronic obstructive pulmonary disease, unspecified: Secondary | ICD-10-CM | POA: Diagnosis not present

## 2020-05-20 DIAGNOSIS — I2510 Atherosclerotic heart disease of native coronary artery without angina pectoris: Secondary | ICD-10-CM | POA: Diagnosis not present

## 2020-05-20 DIAGNOSIS — E875 Hyperkalemia: Secondary | ICD-10-CM | POA: Diagnosis not present

## 2020-05-20 DIAGNOSIS — D500 Iron deficiency anemia secondary to blood loss (chronic): Secondary | ICD-10-CM | POA: Diagnosis not present

## 2020-05-20 DIAGNOSIS — Z951 Presence of aortocoronary bypass graft: Secondary | ICD-10-CM | POA: Diagnosis not present

## 2020-05-20 DIAGNOSIS — K259 Gastric ulcer, unspecified as acute or chronic, without hemorrhage or perforation: Secondary | ICD-10-CM | POA: Diagnosis not present

## 2020-05-20 DIAGNOSIS — E1122 Type 2 diabetes mellitus with diabetic chronic kidney disease: Secondary | ICD-10-CM | POA: Diagnosis not present

## 2020-05-20 DIAGNOSIS — M19041 Primary osteoarthritis, right hand: Secondary | ICD-10-CM | POA: Diagnosis not present

## 2020-05-20 DIAGNOSIS — Z9181 History of falling: Secondary | ICD-10-CM | POA: Diagnosis not present

## 2020-05-25 DIAGNOSIS — I129 Hypertensive chronic kidney disease with stage 1 through stage 4 chronic kidney disease, or unspecified chronic kidney disease: Secondary | ICD-10-CM | POA: Diagnosis not present

## 2020-05-25 DIAGNOSIS — Z9181 History of falling: Secondary | ICD-10-CM | POA: Diagnosis not present

## 2020-05-25 DIAGNOSIS — N183 Chronic kidney disease, stage 3 unspecified: Secondary | ICD-10-CM | POA: Diagnosis not present

## 2020-05-25 DIAGNOSIS — I2510 Atherosclerotic heart disease of native coronary artery without angina pectoris: Secondary | ICD-10-CM | POA: Diagnosis not present

## 2020-05-25 DIAGNOSIS — J449 Chronic obstructive pulmonary disease, unspecified: Secondary | ICD-10-CM | POA: Diagnosis not present

## 2020-05-25 DIAGNOSIS — E1122 Type 2 diabetes mellitus with diabetic chronic kidney disease: Secondary | ICD-10-CM | POA: Diagnosis not present

## 2020-05-25 DIAGNOSIS — Z8616 Personal history of COVID-19: Secondary | ICD-10-CM | POA: Diagnosis not present

## 2020-05-25 DIAGNOSIS — D500 Iron deficiency anemia secondary to blood loss (chronic): Secondary | ICD-10-CM | POA: Diagnosis not present

## 2020-05-25 DIAGNOSIS — L02411 Cutaneous abscess of right axilla: Secondary | ICD-10-CM | POA: Diagnosis not present

## 2020-05-25 DIAGNOSIS — Z7984 Long term (current) use of oral hypoglycemic drugs: Secondary | ICD-10-CM | POA: Diagnosis not present

## 2020-05-25 DIAGNOSIS — E875 Hyperkalemia: Secondary | ICD-10-CM | POA: Diagnosis not present

## 2020-05-25 DIAGNOSIS — K259 Gastric ulcer, unspecified as acute or chronic, without hemorrhage or perforation: Secondary | ICD-10-CM | POA: Diagnosis not present

## 2020-05-25 DIAGNOSIS — M19041 Primary osteoarthritis, right hand: Secondary | ICD-10-CM | POA: Diagnosis not present

## 2020-05-25 DIAGNOSIS — E782 Mixed hyperlipidemia: Secondary | ICD-10-CM | POA: Diagnosis not present

## 2020-05-25 DIAGNOSIS — R531 Weakness: Secondary | ICD-10-CM | POA: Diagnosis not present

## 2020-05-25 DIAGNOSIS — M19042 Primary osteoarthritis, left hand: Secondary | ICD-10-CM | POA: Diagnosis not present

## 2020-05-25 DIAGNOSIS — M722 Plantar fascial fibromatosis: Secondary | ICD-10-CM | POA: Diagnosis not present

## 2020-05-25 DIAGNOSIS — Z951 Presence of aortocoronary bypass graft: Secondary | ICD-10-CM | POA: Diagnosis not present

## 2020-05-28 DIAGNOSIS — E875 Hyperkalemia: Secondary | ICD-10-CM | POA: Diagnosis not present

## 2020-05-28 DIAGNOSIS — Z951 Presence of aortocoronary bypass graft: Secondary | ICD-10-CM | POA: Diagnosis not present

## 2020-05-28 DIAGNOSIS — I2510 Atherosclerotic heart disease of native coronary artery without angina pectoris: Secondary | ICD-10-CM | POA: Diagnosis not present

## 2020-05-28 DIAGNOSIS — D500 Iron deficiency anemia secondary to blood loss (chronic): Secondary | ICD-10-CM | POA: Diagnosis not present

## 2020-05-28 DIAGNOSIS — Z7984 Long term (current) use of oral hypoglycemic drugs: Secondary | ICD-10-CM | POA: Diagnosis not present

## 2020-05-28 DIAGNOSIS — L02411 Cutaneous abscess of right axilla: Secondary | ICD-10-CM | POA: Diagnosis not present

## 2020-05-28 DIAGNOSIS — E1122 Type 2 diabetes mellitus with diabetic chronic kidney disease: Secondary | ICD-10-CM | POA: Diagnosis not present

## 2020-05-28 DIAGNOSIS — J449 Chronic obstructive pulmonary disease, unspecified: Secondary | ICD-10-CM | POA: Diagnosis not present

## 2020-05-28 DIAGNOSIS — M19042 Primary osteoarthritis, left hand: Secondary | ICD-10-CM | POA: Diagnosis not present

## 2020-05-28 DIAGNOSIS — K259 Gastric ulcer, unspecified as acute or chronic, without hemorrhage or perforation: Secondary | ICD-10-CM | POA: Diagnosis not present

## 2020-05-28 DIAGNOSIS — I129 Hypertensive chronic kidney disease with stage 1 through stage 4 chronic kidney disease, or unspecified chronic kidney disease: Secondary | ICD-10-CM | POA: Diagnosis not present

## 2020-05-28 DIAGNOSIS — Z8616 Personal history of COVID-19: Secondary | ICD-10-CM | POA: Diagnosis not present

## 2020-05-28 DIAGNOSIS — R531 Weakness: Secondary | ICD-10-CM | POA: Diagnosis not present

## 2020-05-28 DIAGNOSIS — M722 Plantar fascial fibromatosis: Secondary | ICD-10-CM | POA: Diagnosis not present

## 2020-05-28 DIAGNOSIS — N183 Chronic kidney disease, stage 3 unspecified: Secondary | ICD-10-CM | POA: Diagnosis not present

## 2020-05-28 DIAGNOSIS — E782 Mixed hyperlipidemia: Secondary | ICD-10-CM | POA: Diagnosis not present

## 2020-05-28 DIAGNOSIS — Z9181 History of falling: Secondary | ICD-10-CM | POA: Diagnosis not present

## 2020-05-28 DIAGNOSIS — M19041 Primary osteoarthritis, right hand: Secondary | ICD-10-CM | POA: Diagnosis not present

## 2020-06-01 DIAGNOSIS — Z7984 Long term (current) use of oral hypoglycemic drugs: Secondary | ICD-10-CM | POA: Diagnosis not present

## 2020-06-01 DIAGNOSIS — L02411 Cutaneous abscess of right axilla: Secondary | ICD-10-CM | POA: Diagnosis not present

## 2020-06-01 DIAGNOSIS — I129 Hypertensive chronic kidney disease with stage 1 through stage 4 chronic kidney disease, or unspecified chronic kidney disease: Secondary | ICD-10-CM | POA: Diagnosis not present

## 2020-06-01 DIAGNOSIS — E782 Mixed hyperlipidemia: Secondary | ICD-10-CM | POA: Diagnosis not present

## 2020-06-01 DIAGNOSIS — Z951 Presence of aortocoronary bypass graft: Secondary | ICD-10-CM | POA: Diagnosis not present

## 2020-06-01 DIAGNOSIS — M19041 Primary osteoarthritis, right hand: Secondary | ICD-10-CM | POA: Diagnosis not present

## 2020-06-01 DIAGNOSIS — E875 Hyperkalemia: Secondary | ICD-10-CM | POA: Diagnosis not present

## 2020-06-01 DIAGNOSIS — M722 Plantar fascial fibromatosis: Secondary | ICD-10-CM | POA: Diagnosis not present

## 2020-06-01 DIAGNOSIS — Z8616 Personal history of COVID-19: Secondary | ICD-10-CM | POA: Diagnosis not present

## 2020-06-01 DIAGNOSIS — R531 Weakness: Secondary | ICD-10-CM | POA: Diagnosis not present

## 2020-06-01 DIAGNOSIS — I2510 Atherosclerotic heart disease of native coronary artery without angina pectoris: Secondary | ICD-10-CM | POA: Diagnosis not present

## 2020-06-01 DIAGNOSIS — D500 Iron deficiency anemia secondary to blood loss (chronic): Secondary | ICD-10-CM | POA: Diagnosis not present

## 2020-06-01 DIAGNOSIS — N183 Chronic kidney disease, stage 3 unspecified: Secondary | ICD-10-CM | POA: Diagnosis not present

## 2020-06-01 DIAGNOSIS — K259 Gastric ulcer, unspecified as acute or chronic, without hemorrhage or perforation: Secondary | ICD-10-CM | POA: Diagnosis not present

## 2020-06-01 DIAGNOSIS — E1122 Type 2 diabetes mellitus with diabetic chronic kidney disease: Secondary | ICD-10-CM | POA: Diagnosis not present

## 2020-06-01 DIAGNOSIS — M19042 Primary osteoarthritis, left hand: Secondary | ICD-10-CM | POA: Diagnosis not present

## 2020-06-01 DIAGNOSIS — Z9181 History of falling: Secondary | ICD-10-CM | POA: Diagnosis not present

## 2020-06-01 DIAGNOSIS — J449 Chronic obstructive pulmonary disease, unspecified: Secondary | ICD-10-CM | POA: Diagnosis not present

## 2020-06-04 DIAGNOSIS — E782 Mixed hyperlipidemia: Secondary | ICD-10-CM | POA: Diagnosis not present

## 2020-06-04 DIAGNOSIS — N183 Chronic kidney disease, stage 3 unspecified: Secondary | ICD-10-CM | POA: Diagnosis not present

## 2020-06-04 DIAGNOSIS — Z8616 Personal history of COVID-19: Secondary | ICD-10-CM | POA: Diagnosis not present

## 2020-06-04 DIAGNOSIS — D500 Iron deficiency anemia secondary to blood loss (chronic): Secondary | ICD-10-CM | POA: Diagnosis not present

## 2020-06-04 DIAGNOSIS — I2510 Atherosclerotic heart disease of native coronary artery without angina pectoris: Secondary | ICD-10-CM | POA: Diagnosis not present

## 2020-06-04 DIAGNOSIS — E1122 Type 2 diabetes mellitus with diabetic chronic kidney disease: Secondary | ICD-10-CM | POA: Diagnosis not present

## 2020-06-04 DIAGNOSIS — J449 Chronic obstructive pulmonary disease, unspecified: Secondary | ICD-10-CM | POA: Diagnosis not present

## 2020-06-04 DIAGNOSIS — M19042 Primary osteoarthritis, left hand: Secondary | ICD-10-CM | POA: Diagnosis not present

## 2020-06-04 DIAGNOSIS — Z951 Presence of aortocoronary bypass graft: Secondary | ICD-10-CM | POA: Diagnosis not present

## 2020-06-04 DIAGNOSIS — Z7984 Long term (current) use of oral hypoglycemic drugs: Secondary | ICD-10-CM | POA: Diagnosis not present

## 2020-06-04 DIAGNOSIS — Z9181 History of falling: Secondary | ICD-10-CM | POA: Diagnosis not present

## 2020-06-04 DIAGNOSIS — M722 Plantar fascial fibromatosis: Secondary | ICD-10-CM | POA: Diagnosis not present

## 2020-06-04 DIAGNOSIS — K259 Gastric ulcer, unspecified as acute or chronic, without hemorrhage or perforation: Secondary | ICD-10-CM | POA: Diagnosis not present

## 2020-06-04 DIAGNOSIS — M19041 Primary osteoarthritis, right hand: Secondary | ICD-10-CM | POA: Diagnosis not present

## 2020-06-04 DIAGNOSIS — R531 Weakness: Secondary | ICD-10-CM | POA: Diagnosis not present

## 2020-06-04 DIAGNOSIS — E875 Hyperkalemia: Secondary | ICD-10-CM | POA: Diagnosis not present

## 2020-06-04 DIAGNOSIS — I129 Hypertensive chronic kidney disease with stage 1 through stage 4 chronic kidney disease, or unspecified chronic kidney disease: Secondary | ICD-10-CM | POA: Diagnosis not present

## 2020-06-04 DIAGNOSIS — L02411 Cutaneous abscess of right axilla: Secondary | ICD-10-CM | POA: Diagnosis not present

## 2020-06-07 DIAGNOSIS — M19041 Primary osteoarthritis, right hand: Secondary | ICD-10-CM | POA: Diagnosis not present

## 2020-06-07 DIAGNOSIS — E782 Mixed hyperlipidemia: Secondary | ICD-10-CM | POA: Diagnosis not present

## 2020-06-07 DIAGNOSIS — K259 Gastric ulcer, unspecified as acute or chronic, without hemorrhage or perforation: Secondary | ICD-10-CM | POA: Diagnosis not present

## 2020-06-07 DIAGNOSIS — Z8616 Personal history of COVID-19: Secondary | ICD-10-CM | POA: Diagnosis not present

## 2020-06-07 DIAGNOSIS — L02411 Cutaneous abscess of right axilla: Secondary | ICD-10-CM | POA: Diagnosis not present

## 2020-06-07 DIAGNOSIS — I2510 Atherosclerotic heart disease of native coronary artery without angina pectoris: Secondary | ICD-10-CM | POA: Diagnosis not present

## 2020-06-07 DIAGNOSIS — Z9181 History of falling: Secondary | ICD-10-CM | POA: Diagnosis not present

## 2020-06-07 DIAGNOSIS — Z951 Presence of aortocoronary bypass graft: Secondary | ICD-10-CM | POA: Diagnosis not present

## 2020-06-07 DIAGNOSIS — J449 Chronic obstructive pulmonary disease, unspecified: Secondary | ICD-10-CM | POA: Diagnosis not present

## 2020-06-07 DIAGNOSIS — I129 Hypertensive chronic kidney disease with stage 1 through stage 4 chronic kidney disease, or unspecified chronic kidney disease: Secondary | ICD-10-CM | POA: Diagnosis not present

## 2020-06-07 DIAGNOSIS — N183 Chronic kidney disease, stage 3 unspecified: Secondary | ICD-10-CM | POA: Diagnosis not present

## 2020-06-07 DIAGNOSIS — M722 Plantar fascial fibromatosis: Secondary | ICD-10-CM | POA: Diagnosis not present

## 2020-06-07 DIAGNOSIS — E1122 Type 2 diabetes mellitus with diabetic chronic kidney disease: Secondary | ICD-10-CM | POA: Diagnosis not present

## 2020-06-07 DIAGNOSIS — E875 Hyperkalemia: Secondary | ICD-10-CM | POA: Diagnosis not present

## 2020-06-07 DIAGNOSIS — R531 Weakness: Secondary | ICD-10-CM | POA: Diagnosis not present

## 2020-06-07 DIAGNOSIS — D500 Iron deficiency anemia secondary to blood loss (chronic): Secondary | ICD-10-CM | POA: Diagnosis not present

## 2020-06-07 DIAGNOSIS — M19042 Primary osteoarthritis, left hand: Secondary | ICD-10-CM | POA: Diagnosis not present

## 2020-06-07 DIAGNOSIS — Z7984 Long term (current) use of oral hypoglycemic drugs: Secondary | ICD-10-CM | POA: Diagnosis not present

## 2020-06-11 DIAGNOSIS — L02411 Cutaneous abscess of right axilla: Secondary | ICD-10-CM | POA: Diagnosis not present

## 2020-06-11 DIAGNOSIS — E1122 Type 2 diabetes mellitus with diabetic chronic kidney disease: Secondary | ICD-10-CM | POA: Diagnosis not present

## 2020-06-11 DIAGNOSIS — E875 Hyperkalemia: Secondary | ICD-10-CM | POA: Diagnosis not present

## 2020-06-11 DIAGNOSIS — D500 Iron deficiency anemia secondary to blood loss (chronic): Secondary | ICD-10-CM | POA: Diagnosis not present

## 2020-06-11 DIAGNOSIS — Z7984 Long term (current) use of oral hypoglycemic drugs: Secondary | ICD-10-CM | POA: Diagnosis not present

## 2020-06-11 DIAGNOSIS — M722 Plantar fascial fibromatosis: Secondary | ICD-10-CM | POA: Diagnosis not present

## 2020-06-11 DIAGNOSIS — Z9181 History of falling: Secondary | ICD-10-CM | POA: Diagnosis not present

## 2020-06-11 DIAGNOSIS — N183 Chronic kidney disease, stage 3 unspecified: Secondary | ICD-10-CM | POA: Diagnosis not present

## 2020-06-11 DIAGNOSIS — M19042 Primary osteoarthritis, left hand: Secondary | ICD-10-CM | POA: Diagnosis not present

## 2020-06-11 DIAGNOSIS — R531 Weakness: Secondary | ICD-10-CM | POA: Diagnosis not present

## 2020-06-11 DIAGNOSIS — I2510 Atherosclerotic heart disease of native coronary artery without angina pectoris: Secondary | ICD-10-CM | POA: Diagnosis not present

## 2020-06-11 DIAGNOSIS — J449 Chronic obstructive pulmonary disease, unspecified: Secondary | ICD-10-CM | POA: Diagnosis not present

## 2020-06-11 DIAGNOSIS — Z951 Presence of aortocoronary bypass graft: Secondary | ICD-10-CM | POA: Diagnosis not present

## 2020-06-11 DIAGNOSIS — E782 Mixed hyperlipidemia: Secondary | ICD-10-CM | POA: Diagnosis not present

## 2020-06-11 DIAGNOSIS — M19041 Primary osteoarthritis, right hand: Secondary | ICD-10-CM | POA: Diagnosis not present

## 2020-06-11 DIAGNOSIS — Z8616 Personal history of COVID-19: Secondary | ICD-10-CM | POA: Diagnosis not present

## 2020-06-11 DIAGNOSIS — K259 Gastric ulcer, unspecified as acute or chronic, without hemorrhage or perforation: Secondary | ICD-10-CM | POA: Diagnosis not present

## 2020-06-11 DIAGNOSIS — I129 Hypertensive chronic kidney disease with stage 1 through stage 4 chronic kidney disease, or unspecified chronic kidney disease: Secondary | ICD-10-CM | POA: Diagnosis not present

## 2020-06-16 DIAGNOSIS — Z951 Presence of aortocoronary bypass graft: Secondary | ICD-10-CM | POA: Diagnosis not present

## 2020-06-16 DIAGNOSIS — E875 Hyperkalemia: Secondary | ICD-10-CM | POA: Diagnosis not present

## 2020-06-16 DIAGNOSIS — K259 Gastric ulcer, unspecified as acute or chronic, without hemorrhage or perforation: Secondary | ICD-10-CM | POA: Diagnosis not present

## 2020-06-16 DIAGNOSIS — M722 Plantar fascial fibromatosis: Secondary | ICD-10-CM | POA: Diagnosis not present

## 2020-06-16 DIAGNOSIS — Z9181 History of falling: Secondary | ICD-10-CM | POA: Diagnosis not present

## 2020-06-16 DIAGNOSIS — Z7984 Long term (current) use of oral hypoglycemic drugs: Secondary | ICD-10-CM | POA: Diagnosis not present

## 2020-06-16 DIAGNOSIS — M19041 Primary osteoarthritis, right hand: Secondary | ICD-10-CM | POA: Diagnosis not present

## 2020-06-16 DIAGNOSIS — Z8616 Personal history of COVID-19: Secondary | ICD-10-CM | POA: Diagnosis not present

## 2020-06-16 DIAGNOSIS — I129 Hypertensive chronic kidney disease with stage 1 through stage 4 chronic kidney disease, or unspecified chronic kidney disease: Secondary | ICD-10-CM | POA: Diagnosis not present

## 2020-06-16 DIAGNOSIS — E1122 Type 2 diabetes mellitus with diabetic chronic kidney disease: Secondary | ICD-10-CM | POA: Diagnosis not present

## 2020-06-16 DIAGNOSIS — N183 Chronic kidney disease, stage 3 unspecified: Secondary | ICD-10-CM | POA: Diagnosis not present

## 2020-06-16 DIAGNOSIS — J449 Chronic obstructive pulmonary disease, unspecified: Secondary | ICD-10-CM | POA: Diagnosis not present

## 2020-06-16 DIAGNOSIS — D500 Iron deficiency anemia secondary to blood loss (chronic): Secondary | ICD-10-CM | POA: Diagnosis not present

## 2020-06-16 DIAGNOSIS — E782 Mixed hyperlipidemia: Secondary | ICD-10-CM | POA: Diagnosis not present

## 2020-06-16 DIAGNOSIS — M19042 Primary osteoarthritis, left hand: Secondary | ICD-10-CM | POA: Diagnosis not present

## 2020-06-16 DIAGNOSIS — R531 Weakness: Secondary | ICD-10-CM | POA: Diagnosis not present

## 2020-06-16 DIAGNOSIS — L02411 Cutaneous abscess of right axilla: Secondary | ICD-10-CM | POA: Diagnosis not present

## 2020-06-16 DIAGNOSIS — I2510 Atherosclerotic heart disease of native coronary artery without angina pectoris: Secondary | ICD-10-CM | POA: Diagnosis not present

## 2020-06-18 DIAGNOSIS — Z7984 Long term (current) use of oral hypoglycemic drugs: Secondary | ICD-10-CM | POA: Diagnosis not present

## 2020-06-18 DIAGNOSIS — I2510 Atherosclerotic heart disease of native coronary artery without angina pectoris: Secondary | ICD-10-CM | POA: Diagnosis not present

## 2020-06-18 DIAGNOSIS — D500 Iron deficiency anemia secondary to blood loss (chronic): Secondary | ICD-10-CM | POA: Diagnosis not present

## 2020-06-18 DIAGNOSIS — J449 Chronic obstructive pulmonary disease, unspecified: Secondary | ICD-10-CM | POA: Diagnosis not present

## 2020-06-18 DIAGNOSIS — M19041 Primary osteoarthritis, right hand: Secondary | ICD-10-CM | POA: Diagnosis not present

## 2020-06-18 DIAGNOSIS — Z951 Presence of aortocoronary bypass graft: Secondary | ICD-10-CM | POA: Diagnosis not present

## 2020-06-18 DIAGNOSIS — Z8616 Personal history of COVID-19: Secondary | ICD-10-CM | POA: Diagnosis not present

## 2020-06-18 DIAGNOSIS — M722 Plantar fascial fibromatosis: Secondary | ICD-10-CM | POA: Diagnosis not present

## 2020-06-18 DIAGNOSIS — L02411 Cutaneous abscess of right axilla: Secondary | ICD-10-CM | POA: Diagnosis not present

## 2020-06-18 DIAGNOSIS — E875 Hyperkalemia: Secondary | ICD-10-CM | POA: Diagnosis not present

## 2020-06-18 DIAGNOSIS — E782 Mixed hyperlipidemia: Secondary | ICD-10-CM | POA: Diagnosis not present

## 2020-06-18 DIAGNOSIS — K259 Gastric ulcer, unspecified as acute or chronic, without hemorrhage or perforation: Secondary | ICD-10-CM | POA: Diagnosis not present

## 2020-06-18 DIAGNOSIS — M19042 Primary osteoarthritis, left hand: Secondary | ICD-10-CM | POA: Diagnosis not present

## 2020-06-18 DIAGNOSIS — E1122 Type 2 diabetes mellitus with diabetic chronic kidney disease: Secondary | ICD-10-CM | POA: Diagnosis not present

## 2020-06-18 DIAGNOSIS — N183 Chronic kidney disease, stage 3 unspecified: Secondary | ICD-10-CM | POA: Diagnosis not present

## 2020-06-18 DIAGNOSIS — R531 Weakness: Secondary | ICD-10-CM | POA: Diagnosis not present

## 2020-06-18 DIAGNOSIS — Z9181 History of falling: Secondary | ICD-10-CM | POA: Diagnosis not present

## 2020-06-18 DIAGNOSIS — I129 Hypertensive chronic kidney disease with stage 1 through stage 4 chronic kidney disease, or unspecified chronic kidney disease: Secondary | ICD-10-CM | POA: Diagnosis not present

## 2020-06-21 DIAGNOSIS — R531 Weakness: Secondary | ICD-10-CM | POA: Diagnosis not present

## 2020-06-21 DIAGNOSIS — M19041 Primary osteoarthritis, right hand: Secondary | ICD-10-CM | POA: Diagnosis not present

## 2020-06-21 DIAGNOSIS — I129 Hypertensive chronic kidney disease with stage 1 through stage 4 chronic kidney disease, or unspecified chronic kidney disease: Secondary | ICD-10-CM | POA: Diagnosis not present

## 2020-06-21 DIAGNOSIS — M722 Plantar fascial fibromatosis: Secondary | ICD-10-CM | POA: Diagnosis not present

## 2020-06-21 DIAGNOSIS — N183 Chronic kidney disease, stage 3 unspecified: Secondary | ICD-10-CM | POA: Diagnosis not present

## 2020-06-21 DIAGNOSIS — Z951 Presence of aortocoronary bypass graft: Secondary | ICD-10-CM | POA: Diagnosis not present

## 2020-06-21 DIAGNOSIS — I2510 Atherosclerotic heart disease of native coronary artery without angina pectoris: Secondary | ICD-10-CM | POA: Diagnosis not present

## 2020-06-21 DIAGNOSIS — E1122 Type 2 diabetes mellitus with diabetic chronic kidney disease: Secondary | ICD-10-CM | POA: Diagnosis not present

## 2020-06-21 DIAGNOSIS — K259 Gastric ulcer, unspecified as acute or chronic, without hemorrhage or perforation: Secondary | ICD-10-CM | POA: Diagnosis not present

## 2020-06-21 DIAGNOSIS — E782 Mixed hyperlipidemia: Secondary | ICD-10-CM | POA: Diagnosis not present

## 2020-06-21 DIAGNOSIS — Z7984 Long term (current) use of oral hypoglycemic drugs: Secondary | ICD-10-CM | POA: Diagnosis not present

## 2020-06-21 DIAGNOSIS — L02411 Cutaneous abscess of right axilla: Secondary | ICD-10-CM | POA: Diagnosis not present

## 2020-06-21 DIAGNOSIS — E875 Hyperkalemia: Secondary | ICD-10-CM | POA: Diagnosis not present

## 2020-06-21 DIAGNOSIS — M19042 Primary osteoarthritis, left hand: Secondary | ICD-10-CM | POA: Diagnosis not present

## 2020-06-21 DIAGNOSIS — Z9181 History of falling: Secondary | ICD-10-CM | POA: Diagnosis not present

## 2020-06-21 DIAGNOSIS — Z8616 Personal history of COVID-19: Secondary | ICD-10-CM | POA: Diagnosis not present

## 2020-06-21 DIAGNOSIS — D500 Iron deficiency anemia secondary to blood loss (chronic): Secondary | ICD-10-CM | POA: Diagnosis not present

## 2020-06-21 DIAGNOSIS — J449 Chronic obstructive pulmonary disease, unspecified: Secondary | ICD-10-CM | POA: Diagnosis not present

## 2020-06-23 DIAGNOSIS — D500 Iron deficiency anemia secondary to blood loss (chronic): Secondary | ICD-10-CM | POA: Diagnosis not present

## 2020-06-23 DIAGNOSIS — L02411 Cutaneous abscess of right axilla: Secondary | ICD-10-CM | POA: Diagnosis not present

## 2020-06-23 DIAGNOSIS — J449 Chronic obstructive pulmonary disease, unspecified: Secondary | ICD-10-CM | POA: Diagnosis not present

## 2020-06-23 DIAGNOSIS — E782 Mixed hyperlipidemia: Secondary | ICD-10-CM | POA: Diagnosis not present

## 2020-06-23 DIAGNOSIS — I129 Hypertensive chronic kidney disease with stage 1 through stage 4 chronic kidney disease, or unspecified chronic kidney disease: Secondary | ICD-10-CM | POA: Diagnosis not present

## 2020-06-23 DIAGNOSIS — M19042 Primary osteoarthritis, left hand: Secondary | ICD-10-CM | POA: Diagnosis not present

## 2020-06-23 DIAGNOSIS — Z7984 Long term (current) use of oral hypoglycemic drugs: Secondary | ICD-10-CM | POA: Diagnosis not present

## 2020-06-23 DIAGNOSIS — M19041 Primary osteoarthritis, right hand: Secondary | ICD-10-CM | POA: Diagnosis not present

## 2020-06-23 DIAGNOSIS — E875 Hyperkalemia: Secondary | ICD-10-CM | POA: Diagnosis not present

## 2020-06-23 DIAGNOSIS — Z8616 Personal history of COVID-19: Secondary | ICD-10-CM | POA: Diagnosis not present

## 2020-06-23 DIAGNOSIS — R531 Weakness: Secondary | ICD-10-CM | POA: Diagnosis not present

## 2020-06-23 DIAGNOSIS — K259 Gastric ulcer, unspecified as acute or chronic, without hemorrhage or perforation: Secondary | ICD-10-CM | POA: Diagnosis not present

## 2020-06-23 DIAGNOSIS — N183 Chronic kidney disease, stage 3 unspecified: Secondary | ICD-10-CM | POA: Diagnosis not present

## 2020-06-23 DIAGNOSIS — E1122 Type 2 diabetes mellitus with diabetic chronic kidney disease: Secondary | ICD-10-CM | POA: Diagnosis not present

## 2020-06-23 DIAGNOSIS — M722 Plantar fascial fibromatosis: Secondary | ICD-10-CM | POA: Diagnosis not present

## 2020-06-23 DIAGNOSIS — I2510 Atherosclerotic heart disease of native coronary artery without angina pectoris: Secondary | ICD-10-CM | POA: Diagnosis not present

## 2020-06-23 DIAGNOSIS — Z951 Presence of aortocoronary bypass graft: Secondary | ICD-10-CM | POA: Diagnosis not present

## 2020-06-23 DIAGNOSIS — Z9181 History of falling: Secondary | ICD-10-CM | POA: Diagnosis not present

## 2020-07-02 DIAGNOSIS — I2510 Atherosclerotic heart disease of native coronary artery without angina pectoris: Secondary | ICD-10-CM | POA: Diagnosis not present

## 2020-07-02 DIAGNOSIS — M722 Plantar fascial fibromatosis: Secondary | ICD-10-CM | POA: Diagnosis not present

## 2020-07-02 DIAGNOSIS — M19042 Primary osteoarthritis, left hand: Secondary | ICD-10-CM | POA: Diagnosis not present

## 2020-07-02 DIAGNOSIS — E875 Hyperkalemia: Secondary | ICD-10-CM | POA: Diagnosis not present

## 2020-07-02 DIAGNOSIS — Z8616 Personal history of COVID-19: Secondary | ICD-10-CM | POA: Diagnosis not present

## 2020-07-02 DIAGNOSIS — M19041 Primary osteoarthritis, right hand: Secondary | ICD-10-CM | POA: Diagnosis not present

## 2020-07-02 DIAGNOSIS — E782 Mixed hyperlipidemia: Secondary | ICD-10-CM | POA: Diagnosis not present

## 2020-07-02 DIAGNOSIS — Z7984 Long term (current) use of oral hypoglycemic drugs: Secondary | ICD-10-CM | POA: Diagnosis not present

## 2020-07-02 DIAGNOSIS — Z9181 History of falling: Secondary | ICD-10-CM | POA: Diagnosis not present

## 2020-07-02 DIAGNOSIS — E1122 Type 2 diabetes mellitus with diabetic chronic kidney disease: Secondary | ICD-10-CM | POA: Diagnosis not present

## 2020-07-02 DIAGNOSIS — D500 Iron deficiency anemia secondary to blood loss (chronic): Secondary | ICD-10-CM | POA: Diagnosis not present

## 2020-07-02 DIAGNOSIS — I129 Hypertensive chronic kidney disease with stage 1 through stage 4 chronic kidney disease, or unspecified chronic kidney disease: Secondary | ICD-10-CM | POA: Diagnosis not present

## 2020-07-02 DIAGNOSIS — N183 Chronic kidney disease, stage 3 unspecified: Secondary | ICD-10-CM | POA: Diagnosis not present

## 2020-07-02 DIAGNOSIS — Z951 Presence of aortocoronary bypass graft: Secondary | ICD-10-CM | POA: Diagnosis not present

## 2020-07-02 DIAGNOSIS — K259 Gastric ulcer, unspecified as acute or chronic, without hemorrhage or perforation: Secondary | ICD-10-CM | POA: Diagnosis not present

## 2020-07-02 DIAGNOSIS — J449 Chronic obstructive pulmonary disease, unspecified: Secondary | ICD-10-CM | POA: Diagnosis not present

## 2020-07-12 DIAGNOSIS — M722 Plantar fascial fibromatosis: Secondary | ICD-10-CM | POA: Diagnosis not present

## 2020-07-12 DIAGNOSIS — K259 Gastric ulcer, unspecified as acute or chronic, without hemorrhage or perforation: Secondary | ICD-10-CM | POA: Diagnosis not present

## 2020-07-12 DIAGNOSIS — M19041 Primary osteoarthritis, right hand: Secondary | ICD-10-CM | POA: Diagnosis not present

## 2020-07-12 DIAGNOSIS — Z7984 Long term (current) use of oral hypoglycemic drugs: Secondary | ICD-10-CM | POA: Diagnosis not present

## 2020-07-12 DIAGNOSIS — Z9181 History of falling: Secondary | ICD-10-CM | POA: Diagnosis not present

## 2020-07-12 DIAGNOSIS — N183 Chronic kidney disease, stage 3 unspecified: Secondary | ICD-10-CM | POA: Diagnosis not present

## 2020-07-12 DIAGNOSIS — E875 Hyperkalemia: Secondary | ICD-10-CM | POA: Diagnosis not present

## 2020-07-12 DIAGNOSIS — D500 Iron deficiency anemia secondary to blood loss (chronic): Secondary | ICD-10-CM | POA: Diagnosis not present

## 2020-07-12 DIAGNOSIS — I2510 Atherosclerotic heart disease of native coronary artery without angina pectoris: Secondary | ICD-10-CM | POA: Diagnosis not present

## 2020-07-12 DIAGNOSIS — J449 Chronic obstructive pulmonary disease, unspecified: Secondary | ICD-10-CM | POA: Diagnosis not present

## 2020-07-12 DIAGNOSIS — E1122 Type 2 diabetes mellitus with diabetic chronic kidney disease: Secondary | ICD-10-CM | POA: Diagnosis not present

## 2020-07-12 DIAGNOSIS — M19042 Primary osteoarthritis, left hand: Secondary | ICD-10-CM | POA: Diagnosis not present

## 2020-07-12 DIAGNOSIS — I129 Hypertensive chronic kidney disease with stage 1 through stage 4 chronic kidney disease, or unspecified chronic kidney disease: Secondary | ICD-10-CM | POA: Diagnosis not present

## 2020-07-12 DIAGNOSIS — E782 Mixed hyperlipidemia: Secondary | ICD-10-CM | POA: Diagnosis not present

## 2020-07-12 DIAGNOSIS — Z8616 Personal history of COVID-19: Secondary | ICD-10-CM | POA: Diagnosis not present

## 2020-07-12 DIAGNOSIS — Z951 Presence of aortocoronary bypass graft: Secondary | ICD-10-CM | POA: Diagnosis not present

## 2020-07-14 DIAGNOSIS — Z87891 Personal history of nicotine dependence: Secondary | ICD-10-CM | POA: Diagnosis not present

## 2020-07-14 DIAGNOSIS — I129 Hypertensive chronic kidney disease with stage 1 through stage 4 chronic kidney disease, or unspecified chronic kidney disease: Secondary | ICD-10-CM | POA: Diagnosis not present

## 2020-07-14 DIAGNOSIS — J441 Chronic obstructive pulmonary disease with (acute) exacerbation: Secondary | ICD-10-CM | POA: Diagnosis not present

## 2020-07-14 DIAGNOSIS — E1122 Type 2 diabetes mellitus with diabetic chronic kidney disease: Secondary | ICD-10-CM | POA: Diagnosis not present

## 2020-07-14 DIAGNOSIS — N184 Chronic kidney disease, stage 4 (severe): Secondary | ICD-10-CM | POA: Diagnosis not present

## 2020-07-14 DIAGNOSIS — E7849 Other hyperlipidemia: Secondary | ICD-10-CM | POA: Diagnosis not present

## 2020-07-21 DIAGNOSIS — Z8616 Personal history of COVID-19: Secondary | ICD-10-CM | POA: Diagnosis not present

## 2020-07-21 DIAGNOSIS — E1122 Type 2 diabetes mellitus with diabetic chronic kidney disease: Secondary | ICD-10-CM | POA: Diagnosis not present

## 2020-07-21 DIAGNOSIS — E782 Mixed hyperlipidemia: Secondary | ICD-10-CM | POA: Diagnosis not present

## 2020-07-21 DIAGNOSIS — J449 Chronic obstructive pulmonary disease, unspecified: Secondary | ICD-10-CM | POA: Diagnosis not present

## 2020-07-21 DIAGNOSIS — Z951 Presence of aortocoronary bypass graft: Secondary | ICD-10-CM | POA: Diagnosis not present

## 2020-07-21 DIAGNOSIS — K259 Gastric ulcer, unspecified as acute or chronic, without hemorrhage or perforation: Secondary | ICD-10-CM | POA: Diagnosis not present

## 2020-07-21 DIAGNOSIS — I2510 Atherosclerotic heart disease of native coronary artery without angina pectoris: Secondary | ICD-10-CM | POA: Diagnosis not present

## 2020-07-21 DIAGNOSIS — N183 Chronic kidney disease, stage 3 unspecified: Secondary | ICD-10-CM | POA: Diagnosis not present

## 2020-07-21 DIAGNOSIS — Z9181 History of falling: Secondary | ICD-10-CM | POA: Diagnosis not present

## 2020-07-21 DIAGNOSIS — Z7984 Long term (current) use of oral hypoglycemic drugs: Secondary | ICD-10-CM | POA: Diagnosis not present

## 2020-07-21 DIAGNOSIS — D500 Iron deficiency anemia secondary to blood loss (chronic): Secondary | ICD-10-CM | POA: Diagnosis not present

## 2020-07-21 DIAGNOSIS — M19041 Primary osteoarthritis, right hand: Secondary | ICD-10-CM | POA: Diagnosis not present

## 2020-07-21 DIAGNOSIS — E875 Hyperkalemia: Secondary | ICD-10-CM | POA: Diagnosis not present

## 2020-07-21 DIAGNOSIS — I129 Hypertensive chronic kidney disease with stage 1 through stage 4 chronic kidney disease, or unspecified chronic kidney disease: Secondary | ICD-10-CM | POA: Diagnosis not present

## 2020-07-21 DIAGNOSIS — M19042 Primary osteoarthritis, left hand: Secondary | ICD-10-CM | POA: Diagnosis not present

## 2020-07-21 DIAGNOSIS — M722 Plantar fascial fibromatosis: Secondary | ICD-10-CM | POA: Diagnosis not present

## 2020-08-02 DIAGNOSIS — I10 Essential (primary) hypertension: Secondary | ICD-10-CM | POA: Diagnosis not present

## 2020-08-02 DIAGNOSIS — E782 Mixed hyperlipidemia: Secondary | ICD-10-CM | POA: Diagnosis not present

## 2020-08-02 DIAGNOSIS — E1122 Type 2 diabetes mellitus with diabetic chronic kidney disease: Secondary | ICD-10-CM | POA: Diagnosis not present

## 2020-08-02 DIAGNOSIS — E7849 Other hyperlipidemia: Secondary | ICD-10-CM | POA: Diagnosis not present

## 2020-08-05 DIAGNOSIS — Z1389 Encounter for screening for other disorder: Secondary | ICD-10-CM | POA: Diagnosis not present

## 2020-08-05 DIAGNOSIS — I10 Essential (primary) hypertension: Secondary | ICD-10-CM | POA: Diagnosis not present

## 2020-08-05 DIAGNOSIS — E7849 Other hyperlipidemia: Secondary | ICD-10-CM | POA: Diagnosis not present

## 2020-08-05 DIAGNOSIS — R300 Dysuria: Secondary | ICD-10-CM | POA: Diagnosis not present

## 2020-08-05 DIAGNOSIS — E1122 Type 2 diabetes mellitus with diabetic chronic kidney disease: Secondary | ICD-10-CM | POA: Diagnosis not present

## 2020-08-05 DIAGNOSIS — R54 Age-related physical debility: Secondary | ICD-10-CM | POA: Diagnosis not present

## 2020-08-05 DIAGNOSIS — M25511 Pain in right shoulder: Secondary | ICD-10-CM | POA: Diagnosis not present

## 2020-08-05 DIAGNOSIS — E782 Mixed hyperlipidemia: Secondary | ICD-10-CM | POA: Diagnosis not present

## 2020-08-06 DIAGNOSIS — Z951 Presence of aortocoronary bypass graft: Secondary | ICD-10-CM | POA: Diagnosis not present

## 2020-08-06 DIAGNOSIS — D500 Iron deficiency anemia secondary to blood loss (chronic): Secondary | ICD-10-CM | POA: Diagnosis not present

## 2020-08-06 DIAGNOSIS — I2510 Atherosclerotic heart disease of native coronary artery without angina pectoris: Secondary | ICD-10-CM | POA: Diagnosis not present

## 2020-08-06 DIAGNOSIS — M19041 Primary osteoarthritis, right hand: Secondary | ICD-10-CM | POA: Diagnosis not present

## 2020-08-06 DIAGNOSIS — I129 Hypertensive chronic kidney disease with stage 1 through stage 4 chronic kidney disease, or unspecified chronic kidney disease: Secondary | ICD-10-CM | POA: Diagnosis not present

## 2020-08-06 DIAGNOSIS — E875 Hyperkalemia: Secondary | ICD-10-CM | POA: Diagnosis not present

## 2020-08-06 DIAGNOSIS — Z7984 Long term (current) use of oral hypoglycemic drugs: Secondary | ICD-10-CM | POA: Diagnosis not present

## 2020-08-06 DIAGNOSIS — M19042 Primary osteoarthritis, left hand: Secondary | ICD-10-CM | POA: Diagnosis not present

## 2020-08-06 DIAGNOSIS — Z8616 Personal history of COVID-19: Secondary | ICD-10-CM | POA: Diagnosis not present

## 2020-08-06 DIAGNOSIS — M722 Plantar fascial fibromatosis: Secondary | ICD-10-CM | POA: Diagnosis not present

## 2020-08-06 DIAGNOSIS — Z9181 History of falling: Secondary | ICD-10-CM | POA: Diagnosis not present

## 2020-08-06 DIAGNOSIS — E782 Mixed hyperlipidemia: Secondary | ICD-10-CM | POA: Diagnosis not present

## 2020-08-06 DIAGNOSIS — K259 Gastric ulcer, unspecified as acute or chronic, without hemorrhage or perforation: Secondary | ICD-10-CM | POA: Diagnosis not present

## 2020-08-06 DIAGNOSIS — E1122 Type 2 diabetes mellitus with diabetic chronic kidney disease: Secondary | ICD-10-CM | POA: Diagnosis not present

## 2020-08-06 DIAGNOSIS — J449 Chronic obstructive pulmonary disease, unspecified: Secondary | ICD-10-CM | POA: Diagnosis not present

## 2020-08-06 DIAGNOSIS — N183 Chronic kidney disease, stage 3 unspecified: Secondary | ICD-10-CM | POA: Diagnosis not present

## 2020-08-11 DIAGNOSIS — Z9181 History of falling: Secondary | ICD-10-CM | POA: Diagnosis not present

## 2020-08-11 DIAGNOSIS — Z951 Presence of aortocoronary bypass graft: Secondary | ICD-10-CM | POA: Diagnosis not present

## 2020-08-11 DIAGNOSIS — K259 Gastric ulcer, unspecified as acute or chronic, without hemorrhage or perforation: Secondary | ICD-10-CM | POA: Diagnosis not present

## 2020-08-11 DIAGNOSIS — D500 Iron deficiency anemia secondary to blood loss (chronic): Secondary | ICD-10-CM | POA: Diagnosis not present

## 2020-08-11 DIAGNOSIS — M19042 Primary osteoarthritis, left hand: Secondary | ICD-10-CM | POA: Diagnosis not present

## 2020-08-11 DIAGNOSIS — Z7984 Long term (current) use of oral hypoglycemic drugs: Secondary | ICD-10-CM | POA: Diagnosis not present

## 2020-08-11 DIAGNOSIS — I129 Hypertensive chronic kidney disease with stage 1 through stage 4 chronic kidney disease, or unspecified chronic kidney disease: Secondary | ICD-10-CM | POA: Diagnosis not present

## 2020-08-11 DIAGNOSIS — E1122 Type 2 diabetes mellitus with diabetic chronic kidney disease: Secondary | ICD-10-CM | POA: Diagnosis not present

## 2020-08-11 DIAGNOSIS — I2510 Atherosclerotic heart disease of native coronary artery without angina pectoris: Secondary | ICD-10-CM | POA: Diagnosis not present

## 2020-08-11 DIAGNOSIS — M19041 Primary osteoarthritis, right hand: Secondary | ICD-10-CM | POA: Diagnosis not present

## 2020-08-11 DIAGNOSIS — N183 Chronic kidney disease, stage 3 unspecified: Secondary | ICD-10-CM | POA: Diagnosis not present

## 2020-08-11 DIAGNOSIS — E782 Mixed hyperlipidemia: Secondary | ICD-10-CM | POA: Diagnosis not present

## 2020-08-11 DIAGNOSIS — Z8616 Personal history of COVID-19: Secondary | ICD-10-CM | POA: Diagnosis not present

## 2020-08-11 DIAGNOSIS — E875 Hyperkalemia: Secondary | ICD-10-CM | POA: Diagnosis not present

## 2020-08-11 DIAGNOSIS — M722 Plantar fascial fibromatosis: Secondary | ICD-10-CM | POA: Diagnosis not present

## 2020-08-11 DIAGNOSIS — J449 Chronic obstructive pulmonary disease, unspecified: Secondary | ICD-10-CM | POA: Diagnosis not present

## 2020-08-14 DIAGNOSIS — Z87891 Personal history of nicotine dependence: Secondary | ICD-10-CM | POA: Diagnosis not present

## 2020-08-14 DIAGNOSIS — J441 Chronic obstructive pulmonary disease with (acute) exacerbation: Secondary | ICD-10-CM | POA: Diagnosis not present

## 2020-08-14 DIAGNOSIS — N184 Chronic kidney disease, stage 4 (severe): Secondary | ICD-10-CM | POA: Diagnosis not present

## 2020-08-14 DIAGNOSIS — E1122 Type 2 diabetes mellitus with diabetic chronic kidney disease: Secondary | ICD-10-CM | POA: Diagnosis not present

## 2020-08-14 DIAGNOSIS — E7849 Other hyperlipidemia: Secondary | ICD-10-CM | POA: Diagnosis not present

## 2020-08-14 DIAGNOSIS — I129 Hypertensive chronic kidney disease with stage 1 through stage 4 chronic kidney disease, or unspecified chronic kidney disease: Secondary | ICD-10-CM | POA: Diagnosis not present

## 2020-09-13 DIAGNOSIS — N184 Chronic kidney disease, stage 4 (severe): Secondary | ICD-10-CM | POA: Diagnosis not present

## 2020-09-13 DIAGNOSIS — Z87891 Personal history of nicotine dependence: Secondary | ICD-10-CM | POA: Diagnosis not present

## 2020-09-13 DIAGNOSIS — J441 Chronic obstructive pulmonary disease with (acute) exacerbation: Secondary | ICD-10-CM | POA: Diagnosis not present

## 2020-09-13 DIAGNOSIS — I129 Hypertensive chronic kidney disease with stage 1 through stage 4 chronic kidney disease, or unspecified chronic kidney disease: Secondary | ICD-10-CM | POA: Diagnosis not present

## 2020-09-13 DIAGNOSIS — E7849 Other hyperlipidemia: Secondary | ICD-10-CM | POA: Diagnosis not present

## 2020-09-13 DIAGNOSIS — E1122 Type 2 diabetes mellitus with diabetic chronic kidney disease: Secondary | ICD-10-CM | POA: Diagnosis not present

## 2020-10-29 DIAGNOSIS — I10 Essential (primary) hypertension: Secondary | ICD-10-CM | POA: Diagnosis not present

## 2020-10-29 DIAGNOSIS — E1122 Type 2 diabetes mellitus with diabetic chronic kidney disease: Secondary | ICD-10-CM | POA: Diagnosis not present

## 2020-10-29 DIAGNOSIS — E7849 Other hyperlipidemia: Secondary | ICD-10-CM | POA: Diagnosis not present

## 2020-10-29 DIAGNOSIS — Z1329 Encounter for screening for other suspected endocrine disorder: Secondary | ICD-10-CM | POA: Diagnosis not present

## 2020-10-29 DIAGNOSIS — N184 Chronic kidney disease, stage 4 (severe): Secondary | ICD-10-CM | POA: Diagnosis not present

## 2020-10-29 DIAGNOSIS — E782 Mixed hyperlipidemia: Secondary | ICD-10-CM | POA: Diagnosis not present

## 2020-11-01 DIAGNOSIS — E875 Hyperkalemia: Secondary | ICD-10-CM | POA: Diagnosis not present

## 2020-11-01 DIAGNOSIS — I10 Essential (primary) hypertension: Secondary | ICD-10-CM | POA: Diagnosis not present

## 2020-11-01 DIAGNOSIS — Z6824 Body mass index (BMI) 24.0-24.9, adult: Secondary | ICD-10-CM | POA: Diagnosis not present

## 2020-11-01 DIAGNOSIS — E7849 Other hyperlipidemia: Secondary | ICD-10-CM | POA: Diagnosis not present

## 2020-11-01 DIAGNOSIS — R54 Age-related physical debility: Secondary | ICD-10-CM | POA: Diagnosis not present

## 2020-11-01 DIAGNOSIS — E1122 Type 2 diabetes mellitus with diabetic chronic kidney disease: Secondary | ICD-10-CM | POA: Diagnosis not present

## 2020-12-15 DIAGNOSIS — N184 Chronic kidney disease, stage 4 (severe): Secondary | ICD-10-CM | POA: Diagnosis not present

## 2020-12-15 DIAGNOSIS — E1122 Type 2 diabetes mellitus with diabetic chronic kidney disease: Secondary | ICD-10-CM | POA: Diagnosis not present

## 2020-12-15 DIAGNOSIS — Z87891 Personal history of nicotine dependence: Secondary | ICD-10-CM | POA: Diagnosis not present

## 2020-12-15 DIAGNOSIS — I129 Hypertensive chronic kidney disease with stage 1 through stage 4 chronic kidney disease, or unspecified chronic kidney disease: Secondary | ICD-10-CM | POA: Diagnosis not present

## 2020-12-15 DIAGNOSIS — E7849 Other hyperlipidemia: Secondary | ICD-10-CM | POA: Diagnosis not present

## 2020-12-15 DIAGNOSIS — J441 Chronic obstructive pulmonary disease with (acute) exacerbation: Secondary | ICD-10-CM | POA: Diagnosis not present

## 2021-02-14 DIAGNOSIS — Z23 Encounter for immunization: Secondary | ICD-10-CM | POA: Diagnosis not present

## 2021-02-14 DIAGNOSIS — E1122 Type 2 diabetes mellitus with diabetic chronic kidney disease: Secondary | ICD-10-CM | POA: Diagnosis not present

## 2021-02-14 DIAGNOSIS — R2681 Unsteadiness on feet: Secondary | ICD-10-CM | POA: Diagnosis not present

## 2021-02-14 DIAGNOSIS — E875 Hyperkalemia: Secondary | ICD-10-CM | POA: Diagnosis not present

## 2021-02-14 DIAGNOSIS — I10 Essential (primary) hypertension: Secondary | ICD-10-CM | POA: Diagnosis not present

## 2021-02-14 DIAGNOSIS — R54 Age-related physical debility: Secondary | ICD-10-CM | POA: Diagnosis not present

## 2021-03-16 DIAGNOSIS — N184 Chronic kidney disease, stage 4 (severe): Secondary | ICD-10-CM | POA: Diagnosis not present

## 2021-03-16 DIAGNOSIS — J441 Chronic obstructive pulmonary disease with (acute) exacerbation: Secondary | ICD-10-CM | POA: Diagnosis not present

## 2021-03-16 DIAGNOSIS — I129 Hypertensive chronic kidney disease with stage 1 through stage 4 chronic kidney disease, or unspecified chronic kidney disease: Secondary | ICD-10-CM | POA: Diagnosis not present

## 2021-03-16 DIAGNOSIS — E1122 Type 2 diabetes mellitus with diabetic chronic kidney disease: Secondary | ICD-10-CM | POA: Diagnosis not present

## 2021-03-16 DIAGNOSIS — Z87891 Personal history of nicotine dependence: Secondary | ICD-10-CM | POA: Diagnosis not present

## 2021-03-16 DIAGNOSIS — E7849 Other hyperlipidemia: Secondary | ICD-10-CM | POA: Diagnosis not present

## 2021-05-09 DIAGNOSIS — J441 Chronic obstructive pulmonary disease with (acute) exacerbation: Secondary | ICD-10-CM | POA: Diagnosis not present

## 2021-05-09 DIAGNOSIS — I10 Essential (primary) hypertension: Secondary | ICD-10-CM | POA: Diagnosis not present

## 2021-05-09 DIAGNOSIS — E1165 Type 2 diabetes mellitus with hyperglycemia: Secondary | ICD-10-CM | POA: Diagnosis not present

## 2021-05-09 DIAGNOSIS — E1122 Type 2 diabetes mellitus with diabetic chronic kidney disease: Secondary | ICD-10-CM | POA: Diagnosis not present

## 2021-05-09 DIAGNOSIS — N184 Chronic kidney disease, stage 4 (severe): Secondary | ICD-10-CM | POA: Diagnosis not present

## 2021-05-09 DIAGNOSIS — E782 Mixed hyperlipidemia: Secondary | ICD-10-CM | POA: Diagnosis not present

## 2021-05-09 DIAGNOSIS — E7849 Other hyperlipidemia: Secondary | ICD-10-CM | POA: Diagnosis not present

## 2021-05-13 DIAGNOSIS — R2681 Unsteadiness on feet: Secondary | ICD-10-CM | POA: Diagnosis not present

## 2021-05-13 DIAGNOSIS — E1122 Type 2 diabetes mellitus with diabetic chronic kidney disease: Secondary | ICD-10-CM | POA: Diagnosis not present

## 2021-05-13 DIAGNOSIS — N138 Other obstructive and reflux uropathy: Secondary | ICD-10-CM | POA: Diagnosis not present

## 2021-05-13 DIAGNOSIS — I10 Essential (primary) hypertension: Secondary | ICD-10-CM | POA: Diagnosis not present

## 2021-05-13 DIAGNOSIS — E875 Hyperkalemia: Secondary | ICD-10-CM | POA: Diagnosis not present

## 2021-05-15 DIAGNOSIS — E1122 Type 2 diabetes mellitus with diabetic chronic kidney disease: Secondary | ICD-10-CM | POA: Diagnosis not present

## 2021-05-15 DIAGNOSIS — I129 Hypertensive chronic kidney disease with stage 1 through stage 4 chronic kidney disease, or unspecified chronic kidney disease: Secondary | ICD-10-CM | POA: Diagnosis not present

## 2021-05-26 DIAGNOSIS — E119 Type 2 diabetes mellitus without complications: Secondary | ICD-10-CM | POA: Diagnosis not present

## 2021-05-26 DIAGNOSIS — G8929 Other chronic pain: Secondary | ICD-10-CM | POA: Diagnosis not present

## 2021-05-26 DIAGNOSIS — Z043 Encounter for examination and observation following other accident: Secondary | ICD-10-CM | POA: Diagnosis not present

## 2021-05-26 DIAGNOSIS — M549 Dorsalgia, unspecified: Secondary | ICD-10-CM | POA: Diagnosis not present

## 2021-05-26 DIAGNOSIS — Z7984 Long term (current) use of oral hypoglycemic drugs: Secondary | ICD-10-CM | POA: Diagnosis not present

## 2021-05-26 DIAGNOSIS — Z87891 Personal history of nicotine dependence: Secondary | ICD-10-CM | POA: Diagnosis not present

## 2021-05-26 DIAGNOSIS — N3001 Acute cystitis with hematuria: Secondary | ICD-10-CM | POA: Diagnosis not present

## 2021-05-26 DIAGNOSIS — Z8673 Personal history of transient ischemic attack (TIA), and cerebral infarction without residual deficits: Secondary | ICD-10-CM | POA: Diagnosis not present

## 2021-05-26 DIAGNOSIS — N179 Acute kidney failure, unspecified: Secondary | ICD-10-CM | POA: Diagnosis not present

## 2021-05-26 DIAGNOSIS — Z8616 Personal history of COVID-19: Secondary | ICD-10-CM | POA: Diagnosis not present

## 2021-05-26 DIAGNOSIS — Z66 Do not resuscitate: Secondary | ICD-10-CM | POA: Diagnosis not present

## 2021-05-26 DIAGNOSIS — Z7951 Long term (current) use of inhaled steroids: Secondary | ICD-10-CM | POA: Diagnosis not present

## 2021-05-26 DIAGNOSIS — M25511 Pain in right shoulder: Secondary | ICD-10-CM | POA: Diagnosis not present

## 2021-05-26 DIAGNOSIS — G319 Degenerative disease of nervous system, unspecified: Secondary | ICD-10-CM | POA: Diagnosis not present

## 2021-05-26 DIAGNOSIS — R531 Weakness: Secondary | ICD-10-CM | POA: Diagnosis not present

## 2021-05-26 DIAGNOSIS — Z20822 Contact with and (suspected) exposure to covid-19: Secondary | ICD-10-CM | POA: Diagnosis not present

## 2021-05-26 DIAGNOSIS — M47812 Spondylosis without myelopathy or radiculopathy, cervical region: Secondary | ICD-10-CM | POA: Diagnosis not present

## 2021-05-26 DIAGNOSIS — W1839XA Other fall on same level, initial encounter: Secondary | ICD-10-CM | POA: Diagnosis not present

## 2021-05-26 DIAGNOSIS — I6782 Cerebral ischemia: Secondary | ICD-10-CM | POA: Diagnosis not present

## 2021-05-26 DIAGNOSIS — Z28310 Unvaccinated for covid-19: Secondary | ICD-10-CM | POA: Diagnosis not present

## 2021-05-27 DIAGNOSIS — N390 Urinary tract infection, site not specified: Secondary | ICD-10-CM | POA: Diagnosis not present

## 2021-05-27 DIAGNOSIS — N179 Acute kidney failure, unspecified: Secondary | ICD-10-CM | POA: Diagnosis not present

## 2021-05-27 DIAGNOSIS — R531 Weakness: Secondary | ICD-10-CM | POA: Diagnosis not present

## 2021-05-27 DIAGNOSIS — Z792 Long term (current) use of antibiotics: Secondary | ICD-10-CM | POA: Diagnosis not present

## 2021-05-28 DIAGNOSIS — R059 Cough, unspecified: Secondary | ICD-10-CM | POA: Diagnosis not present

## 2021-05-28 DIAGNOSIS — R531 Weakness: Secondary | ICD-10-CM | POA: Diagnosis not present

## 2021-05-28 DIAGNOSIS — M549 Dorsalgia, unspecified: Secondary | ICD-10-CM | POA: Diagnosis not present

## 2021-05-28 DIAGNOSIS — Z9181 History of falling: Secondary | ICD-10-CM | POA: Diagnosis not present

## 2021-05-28 DIAGNOSIS — N390 Urinary tract infection, site not specified: Secondary | ICD-10-CM | POA: Diagnosis not present

## 2021-05-28 DIAGNOSIS — E119 Type 2 diabetes mellitus without complications: Secondary | ICD-10-CM | POA: Diagnosis not present

## 2021-05-28 DIAGNOSIS — N179 Acute kidney failure, unspecified: Secondary | ICD-10-CM | POA: Diagnosis not present

## 2021-05-28 DIAGNOSIS — M25511 Pain in right shoulder: Secondary | ICD-10-CM | POA: Diagnosis not present

## 2021-05-28 DIAGNOSIS — Z8673 Personal history of transient ischemic attack (TIA), and cerebral infarction without residual deficits: Secondary | ICD-10-CM | POA: Diagnosis not present

## 2021-05-30 DIAGNOSIS — Z8673 Personal history of transient ischemic attack (TIA), and cerebral infarction without residual deficits: Secondary | ICD-10-CM | POA: Diagnosis not present

## 2021-05-30 DIAGNOSIS — E119 Type 2 diabetes mellitus without complications: Secondary | ICD-10-CM | POA: Diagnosis not present

## 2021-05-30 DIAGNOSIS — N390 Urinary tract infection, site not specified: Secondary | ICD-10-CM | POA: Diagnosis not present

## 2021-05-30 DIAGNOSIS — Z7984 Long term (current) use of oral hypoglycemic drugs: Secondary | ICD-10-CM | POA: Diagnosis not present

## 2021-05-30 DIAGNOSIS — W19XXXD Unspecified fall, subsequent encounter: Secondary | ICD-10-CM | POA: Diagnosis not present

## 2021-05-30 DIAGNOSIS — N179 Acute kidney failure, unspecified: Secondary | ICD-10-CM | POA: Diagnosis not present

## 2021-05-30 DIAGNOSIS — R531 Weakness: Secondary | ICD-10-CM | POA: Diagnosis not present

## 2021-06-07 DIAGNOSIS — N179 Acute kidney failure, unspecified: Secondary | ICD-10-CM | POA: Diagnosis not present

## 2021-06-07 DIAGNOSIS — E119 Type 2 diabetes mellitus without complications: Secondary | ICD-10-CM | POA: Diagnosis not present

## 2021-06-07 DIAGNOSIS — Z8673 Personal history of transient ischemic attack (TIA), and cerebral infarction without residual deficits: Secondary | ICD-10-CM | POA: Diagnosis not present

## 2021-06-07 DIAGNOSIS — N390 Urinary tract infection, site not specified: Secondary | ICD-10-CM | POA: Diagnosis not present

## 2021-06-07 DIAGNOSIS — W19XXXD Unspecified fall, subsequent encounter: Secondary | ICD-10-CM | POA: Diagnosis not present

## 2021-06-07 DIAGNOSIS — Z7984 Long term (current) use of oral hypoglycemic drugs: Secondary | ICD-10-CM | POA: Diagnosis not present

## 2021-06-07 DIAGNOSIS — R531 Weakness: Secondary | ICD-10-CM | POA: Diagnosis not present

## 2021-06-14 DIAGNOSIS — Z8673 Personal history of transient ischemic attack (TIA), and cerebral infarction without residual deficits: Secondary | ICD-10-CM | POA: Diagnosis not present

## 2021-06-14 DIAGNOSIS — Z7984 Long term (current) use of oral hypoglycemic drugs: Secondary | ICD-10-CM | POA: Diagnosis not present

## 2021-06-14 DIAGNOSIS — N390 Urinary tract infection, site not specified: Secondary | ICD-10-CM | POA: Diagnosis not present

## 2021-06-14 DIAGNOSIS — E119 Type 2 diabetes mellitus without complications: Secondary | ICD-10-CM | POA: Diagnosis not present

## 2021-06-14 DIAGNOSIS — W19XXXD Unspecified fall, subsequent encounter: Secondary | ICD-10-CM | POA: Diagnosis not present

## 2021-06-14 DIAGNOSIS — R531 Weakness: Secondary | ICD-10-CM | POA: Diagnosis not present

## 2021-06-14 DIAGNOSIS — N179 Acute kidney failure, unspecified: Secondary | ICD-10-CM | POA: Diagnosis not present

## 2021-06-16 DIAGNOSIS — W19XXXD Unspecified fall, subsequent encounter: Secondary | ICD-10-CM | POA: Diagnosis not present

## 2021-06-16 DIAGNOSIS — Z7984 Long term (current) use of oral hypoglycemic drugs: Secondary | ICD-10-CM | POA: Diagnosis not present

## 2021-06-16 DIAGNOSIS — R300 Dysuria: Secondary | ICD-10-CM | POA: Diagnosis not present

## 2021-06-16 DIAGNOSIS — N390 Urinary tract infection, site not specified: Secondary | ICD-10-CM | POA: Diagnosis not present

## 2021-06-16 DIAGNOSIS — R531 Weakness: Secondary | ICD-10-CM | POA: Diagnosis not present

## 2021-06-16 DIAGNOSIS — N179 Acute kidney failure, unspecified: Secondary | ICD-10-CM | POA: Diagnosis not present

## 2021-06-16 DIAGNOSIS — Z8673 Personal history of transient ischemic attack (TIA), and cerebral infarction without residual deficits: Secondary | ICD-10-CM | POA: Diagnosis not present

## 2021-06-16 DIAGNOSIS — E119 Type 2 diabetes mellitus without complications: Secondary | ICD-10-CM | POA: Diagnosis not present

## 2021-06-21 DIAGNOSIS — Z8673 Personal history of transient ischemic attack (TIA), and cerebral infarction without residual deficits: Secondary | ICD-10-CM | POA: Diagnosis not present

## 2021-06-21 DIAGNOSIS — Z7984 Long term (current) use of oral hypoglycemic drugs: Secondary | ICD-10-CM | POA: Diagnosis not present

## 2021-06-21 DIAGNOSIS — N390 Urinary tract infection, site not specified: Secondary | ICD-10-CM | POA: Diagnosis not present

## 2021-06-21 DIAGNOSIS — N179 Acute kidney failure, unspecified: Secondary | ICD-10-CM | POA: Diagnosis not present

## 2021-06-21 DIAGNOSIS — E119 Type 2 diabetes mellitus without complications: Secondary | ICD-10-CM | POA: Diagnosis not present

## 2021-06-21 DIAGNOSIS — R531 Weakness: Secondary | ICD-10-CM | POA: Diagnosis not present

## 2021-06-21 DIAGNOSIS — W19XXXD Unspecified fall, subsequent encounter: Secondary | ICD-10-CM | POA: Diagnosis not present

## 2021-06-27 DIAGNOSIS — R2681 Unsteadiness on feet: Secondary | ICD-10-CM | POA: Diagnosis not present

## 2021-06-27 DIAGNOSIS — R531 Weakness: Secondary | ICD-10-CM | POA: Diagnosis not present

## 2021-06-27 DIAGNOSIS — N183 Chronic kidney disease, stage 3 unspecified: Secondary | ICD-10-CM | POA: Diagnosis not present

## 2021-06-27 DIAGNOSIS — N390 Urinary tract infection, site not specified: Secondary | ICD-10-CM | POA: Diagnosis not present

## 2021-06-27 DIAGNOSIS — M7989 Other specified soft tissue disorders: Secondary | ICD-10-CM | POA: Diagnosis not present

## 2021-06-27 DIAGNOSIS — N179 Acute kidney failure, unspecified: Secondary | ICD-10-CM | POA: Diagnosis not present

## 2021-06-27 DIAGNOSIS — E1122 Type 2 diabetes mellitus with diabetic chronic kidney disease: Secondary | ICD-10-CM | POA: Diagnosis not present

## 2021-06-28 DIAGNOSIS — N390 Urinary tract infection, site not specified: Secondary | ICD-10-CM | POA: Diagnosis not present

## 2021-06-28 DIAGNOSIS — Z7984 Long term (current) use of oral hypoglycemic drugs: Secondary | ICD-10-CM | POA: Diagnosis not present

## 2021-06-28 DIAGNOSIS — Z8673 Personal history of transient ischemic attack (TIA), and cerebral infarction without residual deficits: Secondary | ICD-10-CM | POA: Diagnosis not present

## 2021-06-28 DIAGNOSIS — W19XXXD Unspecified fall, subsequent encounter: Secondary | ICD-10-CM | POA: Diagnosis not present

## 2021-06-28 DIAGNOSIS — N179 Acute kidney failure, unspecified: Secondary | ICD-10-CM | POA: Diagnosis not present

## 2021-06-28 DIAGNOSIS — R531 Weakness: Secondary | ICD-10-CM | POA: Diagnosis not present

## 2021-06-28 DIAGNOSIS — E119 Type 2 diabetes mellitus without complications: Secondary | ICD-10-CM | POA: Diagnosis not present

## 2021-07-05 DIAGNOSIS — N179 Acute kidney failure, unspecified: Secondary | ICD-10-CM | POA: Diagnosis not present

## 2021-07-05 DIAGNOSIS — R531 Weakness: Secondary | ICD-10-CM | POA: Diagnosis not present

## 2021-07-05 DIAGNOSIS — E119 Type 2 diabetes mellitus without complications: Secondary | ICD-10-CM | POA: Diagnosis not present

## 2021-07-05 DIAGNOSIS — Z8673 Personal history of transient ischemic attack (TIA), and cerebral infarction without residual deficits: Secondary | ICD-10-CM | POA: Diagnosis not present

## 2021-07-05 DIAGNOSIS — N390 Urinary tract infection, site not specified: Secondary | ICD-10-CM | POA: Diagnosis not present

## 2021-07-05 DIAGNOSIS — W19XXXD Unspecified fall, subsequent encounter: Secondary | ICD-10-CM | POA: Diagnosis not present

## 2021-07-05 DIAGNOSIS — Z7984 Long term (current) use of oral hypoglycemic drugs: Secondary | ICD-10-CM | POA: Diagnosis not present

## 2021-07-07 DIAGNOSIS — N390 Urinary tract infection, site not specified: Secondary | ICD-10-CM | POA: Diagnosis not present

## 2021-07-07 DIAGNOSIS — E119 Type 2 diabetes mellitus without complications: Secondary | ICD-10-CM | POA: Diagnosis not present

## 2021-07-07 DIAGNOSIS — Z7984 Long term (current) use of oral hypoglycemic drugs: Secondary | ICD-10-CM | POA: Diagnosis not present

## 2021-07-07 DIAGNOSIS — R531 Weakness: Secondary | ICD-10-CM | POA: Diagnosis not present

## 2021-07-07 DIAGNOSIS — N179 Acute kidney failure, unspecified: Secondary | ICD-10-CM | POA: Diagnosis not present

## 2021-07-07 DIAGNOSIS — Z8673 Personal history of transient ischemic attack (TIA), and cerebral infarction without residual deficits: Secondary | ICD-10-CM | POA: Diagnosis not present

## 2021-07-07 DIAGNOSIS — W19XXXD Unspecified fall, subsequent encounter: Secondary | ICD-10-CM | POA: Diagnosis not present

## 2021-07-10 DIAGNOSIS — I63233 Cerebral infarction due to unspecified occlusion or stenosis of bilateral carotid arteries: Secondary | ICD-10-CM | POA: Diagnosis not present

## 2021-07-10 DIAGNOSIS — Z8616 Personal history of COVID-19: Secondary | ICD-10-CM | POA: Diagnosis not present

## 2021-07-10 DIAGNOSIS — N1832 Chronic kidney disease, stage 3b: Secondary | ICD-10-CM | POA: Diagnosis not present

## 2021-07-10 DIAGNOSIS — I7771 Dissection of carotid artery: Secondary | ICD-10-CM | POA: Diagnosis not present

## 2021-07-10 DIAGNOSIS — Z7982 Long term (current) use of aspirin: Secondary | ICD-10-CM | POA: Diagnosis not present

## 2021-07-10 DIAGNOSIS — Z66 Do not resuscitate: Secondary | ICD-10-CM | POA: Diagnosis not present

## 2021-07-10 DIAGNOSIS — Z20822 Contact with and (suspected) exposure to covid-19: Secondary | ICD-10-CM | POA: Diagnosis not present

## 2021-07-10 DIAGNOSIS — E119 Type 2 diabetes mellitus without complications: Secondary | ICD-10-CM | POA: Diagnosis not present

## 2021-07-10 DIAGNOSIS — R4781 Slurred speech: Secondary | ICD-10-CM | POA: Diagnosis not present

## 2021-07-10 DIAGNOSIS — R29700 NIHSS score 0: Secondary | ICD-10-CM | POA: Diagnosis not present

## 2021-07-10 DIAGNOSIS — I639 Cerebral infarction, unspecified: Secondary | ICD-10-CM | POA: Diagnosis not present

## 2021-07-10 DIAGNOSIS — I69851 Hemiplegia and hemiparesis following other cerebrovascular disease affecting right dominant side: Secondary | ICD-10-CM | POA: Diagnosis not present

## 2021-07-10 DIAGNOSIS — I6523 Occlusion and stenosis of bilateral carotid arteries: Secondary | ICD-10-CM | POA: Diagnosis not present

## 2021-07-10 DIAGNOSIS — G319 Degenerative disease of nervous system, unspecified: Secondary | ICD-10-CM | POA: Diagnosis not present

## 2021-07-10 DIAGNOSIS — Z743 Need for continuous supervision: Secondary | ICD-10-CM | POA: Diagnosis not present

## 2021-07-10 DIAGNOSIS — R531 Weakness: Secondary | ICD-10-CM | POA: Diagnosis not present

## 2021-07-10 DIAGNOSIS — Z79899 Other long term (current) drug therapy: Secondary | ICD-10-CM | POA: Diagnosis not present

## 2021-07-14 DIAGNOSIS — D649 Anemia, unspecified: Secondary | ICD-10-CM | POA: Diagnosis not present

## 2021-07-14 DIAGNOSIS — N179 Acute kidney failure, unspecified: Secondary | ICD-10-CM | POA: Diagnosis not present

## 2021-07-14 DIAGNOSIS — Z8673 Personal history of transient ischemic attack (TIA), and cerebral infarction without residual deficits: Secondary | ICD-10-CM | POA: Diagnosis not present

## 2021-07-14 DIAGNOSIS — D62 Acute posthemorrhagic anemia: Secondary | ICD-10-CM | POA: Diagnosis not present

## 2021-07-14 DIAGNOSIS — Z20822 Contact with and (suspected) exposure to covid-19: Secondary | ICD-10-CM | POA: Diagnosis not present

## 2021-07-14 DIAGNOSIS — I2510 Atherosclerotic heart disease of native coronary artery without angina pectoris: Secondary | ICD-10-CM | POA: Diagnosis not present

## 2021-07-14 DIAGNOSIS — R310 Gross hematuria: Secondary | ICD-10-CM | POA: Diagnosis not present

## 2021-07-14 DIAGNOSIS — R3914 Feeling of incomplete bladder emptying: Secondary | ICD-10-CM | POA: Diagnosis not present

## 2021-07-14 DIAGNOSIS — E782 Mixed hyperlipidemia: Secondary | ICD-10-CM | POA: Diagnosis not present

## 2021-07-14 DIAGNOSIS — Z7984 Long term (current) use of oral hypoglycemic drugs: Secondary | ICD-10-CM | POA: Diagnosis not present

## 2021-07-14 DIAGNOSIS — N138 Other obstructive and reflux uropathy: Secondary | ICD-10-CM | POA: Diagnosis not present

## 2021-07-14 DIAGNOSIS — Z9049 Acquired absence of other specified parts of digestive tract: Secondary | ICD-10-CM | POA: Diagnosis not present

## 2021-07-14 DIAGNOSIS — I69351 Hemiplegia and hemiparesis following cerebral infarction affecting right dominant side: Secondary | ICD-10-CM | POA: Diagnosis not present

## 2021-07-14 DIAGNOSIS — E785 Hyperlipidemia, unspecified: Secondary | ICD-10-CM | POA: Diagnosis not present

## 2021-07-14 DIAGNOSIS — R338 Other retention of urine: Secondary | ICD-10-CM | POA: Diagnosis not present

## 2021-07-14 DIAGNOSIS — R319 Hematuria, unspecified: Secondary | ICD-10-CM | POA: Diagnosis not present

## 2021-07-14 DIAGNOSIS — Z8616 Personal history of COVID-19: Secondary | ICD-10-CM | POA: Diagnosis not present

## 2021-07-14 DIAGNOSIS — N3091 Cystitis, unspecified with hematuria: Secondary | ICD-10-CM | POA: Diagnosis not present

## 2021-07-14 DIAGNOSIS — I700 Atherosclerosis of aorta: Secondary | ICD-10-CM | POA: Diagnosis not present

## 2021-07-14 DIAGNOSIS — Z66 Do not resuscitate: Secondary | ICD-10-CM | POA: Diagnosis not present

## 2021-07-14 DIAGNOSIS — N3289 Other specified disorders of bladder: Secondary | ICD-10-CM | POA: Diagnosis not present

## 2021-07-14 DIAGNOSIS — Z736 Limitation of activities due to disability: Secondary | ICD-10-CM | POA: Diagnosis not present

## 2021-07-14 DIAGNOSIS — N289 Disorder of kidney and ureter, unspecified: Secondary | ICD-10-CM | POA: Diagnosis not present

## 2021-07-14 DIAGNOSIS — Z951 Presence of aortocoronary bypass graft: Secondary | ICD-10-CM | POA: Diagnosis not present

## 2021-07-14 DIAGNOSIS — Z7982 Long term (current) use of aspirin: Secondary | ICD-10-CM | POA: Diagnosis not present

## 2021-07-14 DIAGNOSIS — Z79899 Other long term (current) drug therapy: Secondary | ICD-10-CM | POA: Diagnosis not present

## 2021-07-14 DIAGNOSIS — E1169 Type 2 diabetes mellitus with other specified complication: Secondary | ICD-10-CM | POA: Diagnosis not present

## 2021-07-14 DIAGNOSIS — N390 Urinary tract infection, site not specified: Secondary | ICD-10-CM | POA: Diagnosis not present

## 2021-07-14 DIAGNOSIS — R030 Elevated blood-pressure reading, without diagnosis of hypertension: Secondary | ICD-10-CM | POA: Diagnosis not present

## 2021-07-14 DIAGNOSIS — Z7902 Long term (current) use of antithrombotics/antiplatelets: Secondary | ICD-10-CM | POA: Diagnosis not present

## 2021-07-14 DIAGNOSIS — R5381 Other malaise: Secondary | ICD-10-CM | POA: Diagnosis not present

## 2021-07-14 DIAGNOSIS — E119 Type 2 diabetes mellitus without complications: Secondary | ICD-10-CM | POA: Diagnosis not present

## 2021-07-15 DIAGNOSIS — N179 Acute kidney failure, unspecified: Secondary | ICD-10-CM | POA: Diagnosis not present

## 2021-07-15 DIAGNOSIS — R338 Other retention of urine: Secondary | ICD-10-CM | POA: Diagnosis not present

## 2021-07-15 DIAGNOSIS — R310 Gross hematuria: Secondary | ICD-10-CM | POA: Diagnosis not present

## 2021-07-15 DIAGNOSIS — Z736 Limitation of activities due to disability: Secondary | ICD-10-CM | POA: Diagnosis not present

## 2021-07-15 DIAGNOSIS — R030 Elevated blood-pressure reading, without diagnosis of hypertension: Secondary | ICD-10-CM | POA: Diagnosis not present

## 2021-07-15 DIAGNOSIS — Z951 Presence of aortocoronary bypass graft: Secondary | ICD-10-CM | POA: Diagnosis not present

## 2021-07-15 DIAGNOSIS — N3091 Cystitis, unspecified with hematuria: Secondary | ICD-10-CM | POA: Diagnosis not present

## 2021-07-15 DIAGNOSIS — D62 Acute posthemorrhagic anemia: Secondary | ICD-10-CM | POA: Diagnosis not present

## 2021-07-15 DIAGNOSIS — E119 Type 2 diabetes mellitus without complications: Secondary | ICD-10-CM | POA: Diagnosis not present

## 2021-07-15 DIAGNOSIS — Z79899 Other long term (current) drug therapy: Secondary | ICD-10-CM | POA: Diagnosis not present

## 2021-07-15 DIAGNOSIS — Z9049 Acquired absence of other specified parts of digestive tract: Secondary | ICD-10-CM | POA: Diagnosis not present

## 2021-07-15 DIAGNOSIS — N3289 Other specified disorders of bladder: Secondary | ICD-10-CM | POA: Diagnosis not present

## 2021-07-15 DIAGNOSIS — Z66 Do not resuscitate: Secondary | ICD-10-CM | POA: Diagnosis not present

## 2021-07-15 DIAGNOSIS — Z7902 Long term (current) use of antithrombotics/antiplatelets: Secondary | ICD-10-CM | POA: Diagnosis not present

## 2021-07-15 DIAGNOSIS — Z7984 Long term (current) use of oral hypoglycemic drugs: Secondary | ICD-10-CM | POA: Diagnosis not present

## 2021-07-15 DIAGNOSIS — Z7982 Long term (current) use of aspirin: Secondary | ICD-10-CM | POA: Diagnosis not present

## 2021-07-15 DIAGNOSIS — E785 Hyperlipidemia, unspecified: Secondary | ICD-10-CM | POA: Diagnosis not present

## 2021-07-15 DIAGNOSIS — I69351 Hemiplegia and hemiparesis following cerebral infarction affecting right dominant side: Secondary | ICD-10-CM | POA: Diagnosis not present

## 2021-07-15 DIAGNOSIS — Z8616 Personal history of COVID-19: Secondary | ICD-10-CM | POA: Diagnosis not present

## 2021-07-15 DIAGNOSIS — N138 Other obstructive and reflux uropathy: Secondary | ICD-10-CM | POA: Diagnosis not present

## 2021-07-15 DIAGNOSIS — I2510 Atherosclerotic heart disease of native coronary artery without angina pectoris: Secondary | ICD-10-CM | POA: Diagnosis not present

## 2021-07-16 DIAGNOSIS — R338 Other retention of urine: Secondary | ICD-10-CM | POA: Diagnosis not present

## 2021-07-16 DIAGNOSIS — N3091 Cystitis, unspecified with hematuria: Secondary | ICD-10-CM | POA: Diagnosis not present

## 2021-07-16 DIAGNOSIS — D62 Acute posthemorrhagic anemia: Secondary | ICD-10-CM | POA: Diagnosis not present

## 2021-07-16 DIAGNOSIS — N179 Acute kidney failure, unspecified: Secondary | ICD-10-CM | POA: Diagnosis not present

## 2021-07-16 DIAGNOSIS — Z7902 Long term (current) use of antithrombotics/antiplatelets: Secondary | ICD-10-CM | POA: Diagnosis not present

## 2021-07-16 DIAGNOSIS — I69351 Hemiplegia and hemiparesis following cerebral infarction affecting right dominant side: Secondary | ICD-10-CM | POA: Diagnosis not present

## 2021-07-16 DIAGNOSIS — I2510 Atherosclerotic heart disease of native coronary artery without angina pectoris: Secondary | ICD-10-CM | POA: Diagnosis not present

## 2021-07-16 DIAGNOSIS — Z79899 Other long term (current) drug therapy: Secondary | ICD-10-CM | POA: Diagnosis not present

## 2021-07-16 DIAGNOSIS — E119 Type 2 diabetes mellitus without complications: Secondary | ICD-10-CM | POA: Diagnosis not present

## 2021-07-16 DIAGNOSIS — Z66 Do not resuscitate: Secondary | ICD-10-CM | POA: Diagnosis not present

## 2021-07-16 DIAGNOSIS — Z736 Limitation of activities due to disability: Secondary | ICD-10-CM | POA: Diagnosis not present

## 2021-07-16 DIAGNOSIS — Z8616 Personal history of COVID-19: Secondary | ICD-10-CM | POA: Diagnosis not present

## 2021-07-16 DIAGNOSIS — Z9049 Acquired absence of other specified parts of digestive tract: Secondary | ICD-10-CM | POA: Diagnosis not present

## 2021-07-16 DIAGNOSIS — E785 Hyperlipidemia, unspecified: Secondary | ICD-10-CM | POA: Diagnosis not present

## 2021-07-16 DIAGNOSIS — Z951 Presence of aortocoronary bypass graft: Secondary | ICD-10-CM | POA: Diagnosis not present

## 2021-07-16 DIAGNOSIS — R030 Elevated blood-pressure reading, without diagnosis of hypertension: Secondary | ICD-10-CM | POA: Diagnosis not present

## 2021-07-16 DIAGNOSIS — Z7982 Long term (current) use of aspirin: Secondary | ICD-10-CM | POA: Diagnosis not present

## 2021-07-16 DIAGNOSIS — Z7984 Long term (current) use of oral hypoglycemic drugs: Secondary | ICD-10-CM | POA: Diagnosis not present

## 2021-07-16 DIAGNOSIS — N138 Other obstructive and reflux uropathy: Secondary | ICD-10-CM | POA: Diagnosis not present

## 2021-07-17 DIAGNOSIS — E785 Hyperlipidemia, unspecified: Secondary | ICD-10-CM | POA: Diagnosis not present

## 2021-07-17 DIAGNOSIS — N138 Other obstructive and reflux uropathy: Secondary | ICD-10-CM | POA: Diagnosis not present

## 2021-07-17 DIAGNOSIS — Z79899 Other long term (current) drug therapy: Secondary | ICD-10-CM | POA: Diagnosis not present

## 2021-07-17 DIAGNOSIS — Z7902 Long term (current) use of antithrombotics/antiplatelets: Secondary | ICD-10-CM | POA: Diagnosis not present

## 2021-07-17 DIAGNOSIS — Z66 Do not resuscitate: Secondary | ICD-10-CM | POA: Diagnosis not present

## 2021-07-17 DIAGNOSIS — I2510 Atherosclerotic heart disease of native coronary artery without angina pectoris: Secondary | ICD-10-CM | POA: Diagnosis not present

## 2021-07-17 DIAGNOSIS — E119 Type 2 diabetes mellitus without complications: Secondary | ICD-10-CM | POA: Diagnosis not present

## 2021-07-17 DIAGNOSIS — N3091 Cystitis, unspecified with hematuria: Secondary | ICD-10-CM | POA: Diagnosis not present

## 2021-07-17 DIAGNOSIS — Z9049 Acquired absence of other specified parts of digestive tract: Secondary | ICD-10-CM | POA: Diagnosis not present

## 2021-07-17 DIAGNOSIS — N179 Acute kidney failure, unspecified: Secondary | ICD-10-CM | POA: Diagnosis not present

## 2021-07-17 DIAGNOSIS — R338 Other retention of urine: Secondary | ICD-10-CM | POA: Diagnosis not present

## 2021-07-17 DIAGNOSIS — Z7982 Long term (current) use of aspirin: Secondary | ICD-10-CM | POA: Diagnosis not present

## 2021-07-17 DIAGNOSIS — Z7984 Long term (current) use of oral hypoglycemic drugs: Secondary | ICD-10-CM | POA: Diagnosis not present

## 2021-07-17 DIAGNOSIS — Z951 Presence of aortocoronary bypass graft: Secondary | ICD-10-CM | POA: Diagnosis not present

## 2021-07-17 DIAGNOSIS — I69351 Hemiplegia and hemiparesis following cerebral infarction affecting right dominant side: Secondary | ICD-10-CM | POA: Diagnosis not present

## 2021-07-17 DIAGNOSIS — Z8616 Personal history of COVID-19: Secondary | ICD-10-CM | POA: Diagnosis not present

## 2021-07-17 DIAGNOSIS — R030 Elevated blood-pressure reading, without diagnosis of hypertension: Secondary | ICD-10-CM | POA: Diagnosis not present

## 2021-07-17 DIAGNOSIS — Z736 Limitation of activities due to disability: Secondary | ICD-10-CM | POA: Diagnosis not present

## 2021-07-17 DIAGNOSIS — D62 Acute posthemorrhagic anemia: Secondary | ICD-10-CM | POA: Diagnosis not present

## 2021-07-21 DIAGNOSIS — R310 Gross hematuria: Secondary | ICD-10-CM | POA: Diagnosis not present

## 2021-07-24 DIAGNOSIS — T83193A Other mechanical complication of other urinary stent, initial encounter: Secondary | ICD-10-CM | POA: Diagnosis not present

## 2021-07-24 DIAGNOSIS — R1030 Lower abdominal pain, unspecified: Secondary | ICD-10-CM | POA: Diagnosis not present

## 2021-07-27 DIAGNOSIS — G8194 Hemiplegia, unspecified affecting left nondominant side: Secondary | ICD-10-CM | POA: Diagnosis not present

## 2021-07-27 DIAGNOSIS — I63231 Cerebral infarction due to unspecified occlusion or stenosis of right carotid arteries: Secondary | ICD-10-CM | POA: Diagnosis not present

## 2021-07-27 DIAGNOSIS — R319 Hematuria, unspecified: Secondary | ICD-10-CM | POA: Diagnosis not present

## 2021-07-27 DIAGNOSIS — E1122 Type 2 diabetes mellitus with diabetic chronic kidney disease: Secondary | ICD-10-CM | POA: Diagnosis not present

## 2021-07-27 DIAGNOSIS — M7989 Other specified soft tissue disorders: Secondary | ICD-10-CM | POA: Diagnosis not present

## 2021-07-27 DIAGNOSIS — Z978 Presence of other specified devices: Secondary | ICD-10-CM | POA: Diagnosis not present

## 2021-07-28 DIAGNOSIS — R339 Retention of urine, unspecified: Secondary | ICD-10-CM | POA: Diagnosis not present

## 2021-08-01 DIAGNOSIS — Z7902 Long term (current) use of antithrombotics/antiplatelets: Secondary | ICD-10-CM | POA: Diagnosis not present

## 2021-08-01 DIAGNOSIS — Z7982 Long term (current) use of aspirin: Secondary | ICD-10-CM | POA: Diagnosis not present

## 2021-08-01 DIAGNOSIS — Z79899 Other long term (current) drug therapy: Secondary | ICD-10-CM | POA: Diagnosis not present

## 2021-08-01 DIAGNOSIS — Z8673 Personal history of transient ischemic attack (TIA), and cerebral infarction without residual deficits: Secondary | ICD-10-CM | POA: Diagnosis not present

## 2021-08-01 DIAGNOSIS — N476 Balanoposthitis: Secondary | ICD-10-CM | POA: Diagnosis not present

## 2021-08-01 DIAGNOSIS — E119 Type 2 diabetes mellitus without complications: Secondary | ICD-10-CM | POA: Diagnosis not present

## 2021-08-01 DIAGNOSIS — T83091A Other mechanical complication of indwelling urethral catheter, initial encounter: Secondary | ICD-10-CM | POA: Diagnosis not present

## 2021-08-01 DIAGNOSIS — Z8616 Personal history of COVID-19: Secondary | ICD-10-CM | POA: Diagnosis not present

## 2021-08-02 DIAGNOSIS — R319 Hematuria, unspecified: Secondary | ICD-10-CM | POA: Diagnosis not present

## 2021-08-02 DIAGNOSIS — Z79899 Other long term (current) drug therapy: Secondary | ICD-10-CM | POA: Diagnosis not present

## 2021-08-02 DIAGNOSIS — Z7982 Long term (current) use of aspirin: Secondary | ICD-10-CM | POA: Diagnosis not present

## 2021-08-02 DIAGNOSIS — Z8673 Personal history of transient ischemic attack (TIA), and cerebral infarction without residual deficits: Secondary | ICD-10-CM | POA: Diagnosis not present

## 2021-08-02 DIAGNOSIS — Z66 Do not resuscitate: Secondary | ICD-10-CM | POA: Diagnosis not present

## 2021-08-02 DIAGNOSIS — T83091A Other mechanical complication of indwelling urethral catheter, initial encounter: Secondary | ICD-10-CM | POA: Diagnosis not present

## 2021-08-02 DIAGNOSIS — N390 Urinary tract infection, site not specified: Secondary | ICD-10-CM | POA: Diagnosis not present

## 2021-08-02 DIAGNOSIS — Z7902 Long term (current) use of antithrombotics/antiplatelets: Secondary | ICD-10-CM | POA: Diagnosis not present

## 2021-08-02 DIAGNOSIS — E119 Type 2 diabetes mellitus without complications: Secondary | ICD-10-CM | POA: Diagnosis not present

## 2021-08-02 DIAGNOSIS — Z7984 Long term (current) use of oral hypoglycemic drugs: Secondary | ICD-10-CM | POA: Diagnosis not present

## 2021-08-08 DIAGNOSIS — Z7984 Long term (current) use of oral hypoglycemic drugs: Secondary | ICD-10-CM | POA: Diagnosis not present

## 2021-08-08 DIAGNOSIS — E119 Type 2 diabetes mellitus without complications: Secondary | ICD-10-CM | POA: Diagnosis not present

## 2021-08-08 DIAGNOSIS — T83192A Other mechanical complication of urinary stent, initial encounter: Secondary | ICD-10-CM | POA: Diagnosis not present

## 2021-08-11 DIAGNOSIS — R338 Other retention of urine: Secondary | ICD-10-CM | POA: Diagnosis not present

## 2021-08-14 DIAGNOSIS — E7849 Other hyperlipidemia: Secondary | ICD-10-CM | POA: Diagnosis not present

## 2021-08-14 DIAGNOSIS — N184 Chronic kidney disease, stage 4 (severe): Secondary | ICD-10-CM | POA: Diagnosis not present

## 2021-08-14 DIAGNOSIS — I129 Hypertensive chronic kidney disease with stage 1 through stage 4 chronic kidney disease, or unspecified chronic kidney disease: Secondary | ICD-10-CM | POA: Diagnosis not present

## 2021-08-14 DIAGNOSIS — J441 Chronic obstructive pulmonary disease with (acute) exacerbation: Secondary | ICD-10-CM | POA: Diagnosis not present

## 2021-08-14 DIAGNOSIS — E1122 Type 2 diabetes mellitus with diabetic chronic kidney disease: Secondary | ICD-10-CM | POA: Diagnosis not present

## 2021-08-16 DIAGNOSIS — G8194 Hemiplegia, unspecified affecting left nondominant side: Secondary | ICD-10-CM | POA: Diagnosis not present

## 2021-08-16 DIAGNOSIS — N183 Chronic kidney disease, stage 3 unspecified: Secondary | ICD-10-CM | POA: Diagnosis not present

## 2021-08-16 DIAGNOSIS — E1122 Type 2 diabetes mellitus with diabetic chronic kidney disease: Secondary | ICD-10-CM | POA: Diagnosis not present

## 2021-08-16 DIAGNOSIS — M7989 Other specified soft tissue disorders: Secondary | ICD-10-CM | POA: Diagnosis not present

## 2021-08-16 DIAGNOSIS — R319 Hematuria, unspecified: Secondary | ICD-10-CM | POA: Diagnosis not present

## 2021-08-16 DIAGNOSIS — Z978 Presence of other specified devices: Secondary | ICD-10-CM | POA: Diagnosis not present

## 2021-08-16 DIAGNOSIS — I63231 Cerebral infarction due to unspecified occlusion or stenosis of right carotid arteries: Secondary | ICD-10-CM | POA: Diagnosis not present

## 2021-08-18 DIAGNOSIS — R339 Retention of urine, unspecified: Secondary | ICD-10-CM | POA: Diagnosis not present

## 2021-08-18 DIAGNOSIS — R338 Other retention of urine: Secondary | ICD-10-CM | POA: Diagnosis not present

## 2021-08-23 DIAGNOSIS — E1165 Type 2 diabetes mellitus with hyperglycemia: Secondary | ICD-10-CM | POA: Diagnosis not present

## 2021-08-23 DIAGNOSIS — N184 Chronic kidney disease, stage 4 (severe): Secondary | ICD-10-CM | POA: Diagnosis not present

## 2021-08-23 DIAGNOSIS — E1122 Type 2 diabetes mellitus with diabetic chronic kidney disease: Secondary | ICD-10-CM | POA: Diagnosis not present

## 2021-08-23 DIAGNOSIS — J441 Chronic obstructive pulmonary disease with (acute) exacerbation: Secondary | ICD-10-CM | POA: Diagnosis not present

## 2021-08-23 DIAGNOSIS — E7849 Other hyperlipidemia: Secondary | ICD-10-CM | POA: Diagnosis not present

## 2021-08-23 DIAGNOSIS — I10 Essential (primary) hypertension: Secondary | ICD-10-CM | POA: Diagnosis not present

## 2021-08-23 DIAGNOSIS — E782 Mixed hyperlipidemia: Secondary | ICD-10-CM | POA: Diagnosis not present

## 2021-08-30 DIAGNOSIS — E1122 Type 2 diabetes mellitus with diabetic chronic kidney disease: Secondary | ICD-10-CM | POA: Diagnosis not present

## 2021-08-30 DIAGNOSIS — Z0001 Encounter for general adult medical examination with abnormal findings: Secondary | ICD-10-CM | POA: Diagnosis not present

## 2021-08-30 DIAGNOSIS — R2681 Unsteadiness on feet: Secondary | ICD-10-CM | POA: Diagnosis not present

## 2021-08-30 DIAGNOSIS — R319 Hematuria, unspecified: Secondary | ICD-10-CM | POA: Diagnosis not present

## 2021-08-30 DIAGNOSIS — M7989 Other specified soft tissue disorders: Secondary | ICD-10-CM | POA: Diagnosis not present

## 2021-08-30 DIAGNOSIS — Z978 Presence of other specified devices: Secondary | ICD-10-CM | POA: Diagnosis not present

## 2021-08-30 DIAGNOSIS — G8194 Hemiplegia, unspecified affecting left nondominant side: Secondary | ICD-10-CM | POA: Diagnosis not present

## 2021-08-30 DIAGNOSIS — I10 Essential (primary) hypertension: Secondary | ICD-10-CM | POA: Diagnosis not present

## 2021-09-01 DIAGNOSIS — Z466 Encounter for fitting and adjustment of urinary device: Secondary | ICD-10-CM | POA: Diagnosis not present

## 2021-09-01 DIAGNOSIS — R338 Other retention of urine: Secondary | ICD-10-CM | POA: Diagnosis not present

## 2021-09-01 DIAGNOSIS — R339 Retention of urine, unspecified: Secondary | ICD-10-CM | POA: Diagnosis not present

## 2021-09-15 DIAGNOSIS — R339 Retention of urine, unspecified: Secondary | ICD-10-CM | POA: Diagnosis not present

## 2021-09-29 DIAGNOSIS — R338 Other retention of urine: Secondary | ICD-10-CM | POA: Diagnosis not present

## 2021-10-26 DIAGNOSIS — T83091A Other mechanical complication of indwelling urethral catheter, initial encounter: Secondary | ICD-10-CM | POA: Diagnosis not present

## 2021-10-26 DIAGNOSIS — Z7902 Long term (current) use of antithrombotics/antiplatelets: Secondary | ICD-10-CM | POA: Diagnosis not present

## 2021-10-26 DIAGNOSIS — R319 Hematuria, unspecified: Secondary | ICD-10-CM | POA: Diagnosis not present

## 2021-10-26 DIAGNOSIS — E119 Type 2 diabetes mellitus without complications: Secondary | ICD-10-CM | POA: Diagnosis not present

## 2021-10-26 DIAGNOSIS — Z7982 Long term (current) use of aspirin: Secondary | ICD-10-CM | POA: Diagnosis not present

## 2021-10-26 DIAGNOSIS — R339 Retention of urine, unspecified: Secondary | ICD-10-CM | POA: Diagnosis not present

## 2021-10-27 DIAGNOSIS — Z466 Encounter for fitting and adjustment of urinary device: Secondary | ICD-10-CM | POA: Diagnosis not present

## 2021-10-27 DIAGNOSIS — N3289 Other specified disorders of bladder: Secondary | ICD-10-CM | POA: Diagnosis not present

## 2021-11-14 DIAGNOSIS — I129 Hypertensive chronic kidney disease with stage 1 through stage 4 chronic kidney disease, or unspecified chronic kidney disease: Secondary | ICD-10-CM | POA: Diagnosis not present

## 2021-11-14 DIAGNOSIS — E1122 Type 2 diabetes mellitus with diabetic chronic kidney disease: Secondary | ICD-10-CM | POA: Diagnosis not present

## 2021-11-14 DIAGNOSIS — N184 Chronic kidney disease, stage 4 (severe): Secondary | ICD-10-CM | POA: Diagnosis not present

## 2021-11-14 DIAGNOSIS — J441 Chronic obstructive pulmonary disease with (acute) exacerbation: Secondary | ICD-10-CM | POA: Diagnosis not present

## 2021-11-24 DIAGNOSIS — R338 Other retention of urine: Secondary | ICD-10-CM | POA: Diagnosis not present

## 2021-11-25 DIAGNOSIS — E1165 Type 2 diabetes mellitus with hyperglycemia: Secondary | ICD-10-CM | POA: Diagnosis not present

## 2021-11-25 DIAGNOSIS — N184 Chronic kidney disease, stage 4 (severe): Secondary | ICD-10-CM | POA: Diagnosis not present

## 2021-11-25 DIAGNOSIS — E875 Hyperkalemia: Secondary | ICD-10-CM | POA: Diagnosis not present

## 2021-11-25 DIAGNOSIS — I10 Essential (primary) hypertension: Secondary | ICD-10-CM | POA: Diagnosis not present

## 2021-11-25 DIAGNOSIS — E7849 Other hyperlipidemia: Secondary | ICD-10-CM | POA: Diagnosis not present

## 2021-11-25 DIAGNOSIS — E1122 Type 2 diabetes mellitus with diabetic chronic kidney disease: Secondary | ICD-10-CM | POA: Diagnosis not present

## 2021-12-10 DIAGNOSIS — T83091A Other mechanical complication of indwelling urethral catheter, initial encounter: Secondary | ICD-10-CM | POA: Diagnosis not present

## 2021-12-10 DIAGNOSIS — Z7982 Long term (current) use of aspirin: Secondary | ICD-10-CM | POA: Diagnosis not present

## 2021-12-10 DIAGNOSIS — E119 Type 2 diabetes mellitus without complications: Secondary | ICD-10-CM | POA: Diagnosis not present

## 2021-12-10 DIAGNOSIS — Z7984 Long term (current) use of oral hypoglycemic drugs: Secondary | ICD-10-CM | POA: Diagnosis not present

## 2021-12-10 DIAGNOSIS — T83098A Other mechanical complication of other indwelling urethral catheter, initial encounter: Secondary | ICD-10-CM | POA: Diagnosis not present

## 2021-12-10 DIAGNOSIS — Z8616 Personal history of COVID-19: Secondary | ICD-10-CM | POA: Diagnosis not present

## 2021-12-15 DIAGNOSIS — L8990 Pressure ulcer of unspecified site, unspecified stage: Secondary | ICD-10-CM | POA: Diagnosis not present

## 2021-12-22 DIAGNOSIS — R2681 Unsteadiness on feet: Secondary | ICD-10-CM | POA: Diagnosis not present

## 2021-12-22 DIAGNOSIS — R531 Weakness: Secondary | ICD-10-CM | POA: Diagnosis not present

## 2021-12-22 DIAGNOSIS — L8990 Pressure ulcer of unspecified site, unspecified stage: Secondary | ICD-10-CM | POA: Diagnosis not present

## 2021-12-22 DIAGNOSIS — L89610 Pressure ulcer of right heel, unstageable: Secondary | ICD-10-CM | POA: Diagnosis not present

## 2021-12-29 DIAGNOSIS — R338 Other retention of urine: Secondary | ICD-10-CM | POA: Diagnosis not present

## 2022-01-03 DIAGNOSIS — T148XXA Other injury of unspecified body region, initial encounter: Secondary | ICD-10-CM | POA: Diagnosis not present

## 2022-01-12 DIAGNOSIS — L89610 Pressure ulcer of right heel, unstageable: Secondary | ICD-10-CM | POA: Diagnosis not present

## 2022-01-12 DIAGNOSIS — R2681 Unsteadiness on feet: Secondary | ICD-10-CM | POA: Diagnosis not present

## 2022-01-12 DIAGNOSIS — R531 Weakness: Secondary | ICD-10-CM | POA: Diagnosis not present

## 2022-01-12 DIAGNOSIS — E1122 Type 2 diabetes mellitus with diabetic chronic kidney disease: Secondary | ICD-10-CM | POA: Diagnosis not present

## 2022-01-12 DIAGNOSIS — Z978 Presence of other specified devices: Secondary | ICD-10-CM | POA: Diagnosis not present

## 2022-01-12 DIAGNOSIS — E46 Unspecified protein-calorie malnutrition: Secondary | ICD-10-CM | POA: Diagnosis not present

## 2022-01-12 DIAGNOSIS — D692 Other nonthrombocytopenic purpura: Secondary | ICD-10-CM | POA: Diagnosis not present

## 2022-01-12 DIAGNOSIS — Z23 Encounter for immunization: Secondary | ICD-10-CM | POA: Diagnosis not present

## 2022-01-12 DIAGNOSIS — R030 Elevated blood-pressure reading, without diagnosis of hypertension: Secondary | ICD-10-CM | POA: Diagnosis not present

## 2022-01-16 DIAGNOSIS — Z466 Encounter for fitting and adjustment of urinary device: Secondary | ICD-10-CM | POA: Diagnosis not present

## 2022-01-16 DIAGNOSIS — Z79899 Other long term (current) drug therapy: Secondary | ICD-10-CM | POA: Diagnosis not present

## 2022-01-16 DIAGNOSIS — Z7984 Long term (current) use of oral hypoglycemic drugs: Secondary | ICD-10-CM | POA: Diagnosis not present

## 2022-01-16 DIAGNOSIS — Z8673 Personal history of transient ischemic attack (TIA), and cerebral infarction without residual deficits: Secondary | ICD-10-CM | POA: Diagnosis not present

## 2022-01-16 DIAGNOSIS — Z7902 Long term (current) use of antithrombotics/antiplatelets: Secondary | ICD-10-CM | POA: Diagnosis not present

## 2022-01-16 DIAGNOSIS — E119 Type 2 diabetes mellitus without complications: Secondary | ICD-10-CM | POA: Diagnosis not present

## 2022-01-16 DIAGNOSIS — Z66 Do not resuscitate: Secondary | ICD-10-CM | POA: Diagnosis not present

## 2022-01-16 DIAGNOSIS — Z7982 Long term (current) use of aspirin: Secondary | ICD-10-CM | POA: Diagnosis not present

## 2022-01-16 DIAGNOSIS — T83511A Infection and inflammatory reaction due to indwelling urethral catheter, initial encounter: Secondary | ICD-10-CM | POA: Diagnosis not present

## 2022-01-16 DIAGNOSIS — T83091A Other mechanical complication of indwelling urethral catheter, initial encounter: Secondary | ICD-10-CM | POA: Diagnosis not present

## 2022-01-16 DIAGNOSIS — N390 Urinary tract infection, site not specified: Secondary | ICD-10-CM | POA: Diagnosis not present

## 2022-02-14 DIAGNOSIS — J441 Chronic obstructive pulmonary disease with (acute) exacerbation: Secondary | ICD-10-CM | POA: Diagnosis not present

## 2022-02-14 DIAGNOSIS — E1122 Type 2 diabetes mellitus with diabetic chronic kidney disease: Secondary | ICD-10-CM | POA: Diagnosis not present

## 2022-02-14 DIAGNOSIS — E782 Mixed hyperlipidemia: Secondary | ICD-10-CM | POA: Diagnosis not present

## 2022-02-23 DIAGNOSIS — R319 Hematuria, unspecified: Secondary | ICD-10-CM | POA: Diagnosis not present

## 2022-02-23 DIAGNOSIS — Z7902 Long term (current) use of antithrombotics/antiplatelets: Secondary | ICD-10-CM | POA: Diagnosis not present

## 2022-02-23 DIAGNOSIS — R339 Retention of urine, unspecified: Secondary | ICD-10-CM | POA: Diagnosis not present

## 2022-02-23 DIAGNOSIS — T83098A Other mechanical complication of other indwelling urethral catheter, initial encounter: Secondary | ICD-10-CM | POA: Diagnosis not present

## 2022-02-23 DIAGNOSIS — Z66 Do not resuscitate: Secondary | ICD-10-CM | POA: Diagnosis not present

## 2022-02-23 DIAGNOSIS — T83091A Other mechanical complication of indwelling urethral catheter, initial encounter: Secondary | ICD-10-CM | POA: Diagnosis not present

## 2022-02-23 DIAGNOSIS — Z7984 Long term (current) use of oral hypoglycemic drugs: Secondary | ICD-10-CM | POA: Diagnosis not present

## 2022-02-23 DIAGNOSIS — Z7982 Long term (current) use of aspirin: Secondary | ICD-10-CM | POA: Diagnosis not present

## 2022-02-23 DIAGNOSIS — Z79899 Other long term (current) drug therapy: Secondary | ICD-10-CM | POA: Diagnosis not present

## 2022-02-23 DIAGNOSIS — E119 Type 2 diabetes mellitus without complications: Secondary | ICD-10-CM | POA: Diagnosis not present

## 2022-03-07 DIAGNOSIS — T83091A Other mechanical complication of indwelling urethral catheter, initial encounter: Secondary | ICD-10-CM | POA: Diagnosis not present

## 2022-03-07 DIAGNOSIS — R531 Weakness: Secondary | ICD-10-CM | POA: Diagnosis not present

## 2022-03-07 DIAGNOSIS — E43 Unspecified severe protein-calorie malnutrition: Secondary | ICD-10-CM | POA: Diagnosis not present

## 2022-03-07 DIAGNOSIS — R5381 Other malaise: Secondary | ICD-10-CM | POA: Diagnosis not present

## 2022-03-07 DIAGNOSIS — E1122 Type 2 diabetes mellitus with diabetic chronic kidney disease: Secondary | ICD-10-CM | POA: Diagnosis not present

## 2022-03-07 DIAGNOSIS — Z978 Presence of other specified devices: Secondary | ICD-10-CM | POA: Diagnosis not present

## 2022-03-07 DIAGNOSIS — Z515 Encounter for palliative care: Secondary | ICD-10-CM | POA: Diagnosis not present

## 2022-04-17 DEATH — deceased
